# Patient Record
Sex: Female | Born: 1960 | Race: White | Hispanic: No | Marital: Married | Smoking: Current every day smoker
Health system: Southern US, Community
[De-identification: ages and names within clinical notes are randomized; demographics above are authoritative.]

## PROBLEM LIST (undated history)

## (undated) DIAGNOSIS — M25519 Pain in unspecified shoulder: Secondary | ICD-10-CM

## (undated) DIAGNOSIS — K589 Irritable bowel syndrome without diarrhea: Secondary | ICD-10-CM

## (undated) DIAGNOSIS — Z8489 Family history of other specified conditions: Secondary | ICD-10-CM

## (undated) DIAGNOSIS — G43909 Migraine, unspecified, not intractable, without status migrainosus: Secondary | ICD-10-CM

## (undated) DIAGNOSIS — G8929 Other chronic pain: Secondary | ICD-10-CM

## (undated) HISTORY — PX: COLONOSCOPY: SHX174

## (undated) HISTORY — PX: BACK SURGERY: SHX140

---

## 1997-11-21 ENCOUNTER — Other Ambulatory Visit: Admission: RE | Admit: 1997-11-21 | Discharge: 1997-11-21 | Payer: Self-pay | Admitting: *Deleted

## 1998-05-04 ENCOUNTER — Ambulatory Visit (HOSPITAL_COMMUNITY): Admission: RE | Admit: 1998-05-04 | Discharge: 1998-05-04 | Payer: Self-pay | Admitting: Gastroenterology

## 1998-10-30 ENCOUNTER — Encounter: Payer: Self-pay | Admitting: Family Medicine

## 1998-10-30 ENCOUNTER — Ambulatory Visit (HOSPITAL_COMMUNITY): Admission: RE | Admit: 1998-10-30 | Discharge: 1998-10-30 | Payer: Self-pay | Admitting: Family Medicine

## 1998-12-30 ENCOUNTER — Other Ambulatory Visit: Admission: RE | Admit: 1998-12-30 | Discharge: 1998-12-30 | Payer: Self-pay | Admitting: *Deleted

## 1999-11-25 ENCOUNTER — Encounter: Payer: Self-pay | Admitting: Family Medicine

## 1999-11-25 ENCOUNTER — Encounter: Admission: RE | Admit: 1999-11-25 | Discharge: 1999-11-25 | Payer: Self-pay | Admitting: Family Medicine

## 1999-12-22 ENCOUNTER — Encounter: Payer: Self-pay | Admitting: Neurosurgery

## 1999-12-22 ENCOUNTER — Ambulatory Visit (HOSPITAL_COMMUNITY): Admission: RE | Admit: 1999-12-22 | Discharge: 1999-12-22 | Payer: Self-pay | Admitting: Neurosurgery

## 2001-07-20 ENCOUNTER — Encounter: Payer: Self-pay | Admitting: Family Medicine

## 2001-07-20 ENCOUNTER — Ambulatory Visit (HOSPITAL_COMMUNITY): Admission: RE | Admit: 2001-07-20 | Discharge: 2001-07-20 | Payer: Self-pay | Admitting: Family Medicine

## 2002-02-28 HISTORY — PX: BREAST CYST ASPIRATION: SHX578

## 2002-09-26 ENCOUNTER — Other Ambulatory Visit: Admission: RE | Admit: 2002-09-26 | Discharge: 2002-09-26 | Payer: Self-pay | Admitting: Obstetrics and Gynecology

## 2003-12-16 ENCOUNTER — Ambulatory Visit (HOSPITAL_COMMUNITY): Admission: RE | Admit: 2003-12-16 | Discharge: 2003-12-16 | Payer: Self-pay | Admitting: Family Medicine

## 2005-05-30 ENCOUNTER — Other Ambulatory Visit: Admission: RE | Admit: 2005-05-30 | Discharge: 2005-05-30 | Payer: Self-pay | Admitting: Obstetrics and Gynecology

## 2005-09-22 ENCOUNTER — Encounter: Admission: RE | Admit: 2005-09-22 | Discharge: 2005-09-22 | Payer: Self-pay | Admitting: Obstetrics and Gynecology

## 2005-10-12 ENCOUNTER — Ambulatory Visit: Payer: Self-pay | Admitting: Family Medicine

## 2006-07-05 ENCOUNTER — Telehealth (INDEPENDENT_AMBULATORY_CARE_PROVIDER_SITE_OTHER): Payer: Self-pay | Admitting: *Deleted

## 2006-12-03 ENCOUNTER — Emergency Department (HOSPITAL_COMMUNITY): Admission: EM | Admit: 2006-12-03 | Discharge: 2006-12-03 | Payer: Self-pay | Admitting: Emergency Medicine

## 2007-03-02 ENCOUNTER — Ambulatory Visit: Payer: Self-pay | Admitting: Family Medicine

## 2007-03-02 DIAGNOSIS — F17200 Nicotine dependence, unspecified, uncomplicated: Secondary | ICD-10-CM

## 2007-03-02 DIAGNOSIS — J019 Acute sinusitis, unspecified: Secondary | ICD-10-CM | POA: Insufficient documentation

## 2007-03-16 ENCOUNTER — Telehealth: Payer: Self-pay | Admitting: Family Medicine

## 2007-06-06 ENCOUNTER — Telehealth: Payer: Self-pay | Admitting: Family Medicine

## 2007-08-13 ENCOUNTER — Ambulatory Visit: Payer: Self-pay | Admitting: Family Medicine

## 2007-08-13 DIAGNOSIS — B9789 Other viral agents as the cause of diseases classified elsewhere: Secondary | ICD-10-CM

## 2007-09-13 ENCOUNTER — Telehealth (INDEPENDENT_AMBULATORY_CARE_PROVIDER_SITE_OTHER): Payer: Self-pay | Admitting: *Deleted

## 2007-12-11 ENCOUNTER — Telehealth: Payer: Self-pay | Admitting: Family Medicine

## 2008-02-14 ENCOUNTER — Telehealth: Payer: Self-pay | Admitting: Family Medicine

## 2008-05-27 ENCOUNTER — Telehealth: Payer: Self-pay | Admitting: Family Medicine

## 2008-08-29 ENCOUNTER — Telehealth: Payer: Self-pay | Admitting: Family Medicine

## 2008-11-05 ENCOUNTER — Telehealth: Payer: Self-pay | Admitting: Family Medicine

## 2009-02-16 ENCOUNTER — Emergency Department (HOSPITAL_COMMUNITY): Admission: EM | Admit: 2009-02-16 | Discharge: 2009-02-16 | Payer: Self-pay | Admitting: Family Medicine

## 2009-06-08 ENCOUNTER — Telehealth: Payer: Self-pay | Admitting: Family Medicine

## 2010-03-30 NOTE — Progress Notes (Signed)
Summary: Rx Ascomp with Codeine  Phone Note Refill Request Call back at [phone] Message from:  [facility] on June 08, 2009 11:01 AM  Refills Requested: Medication #1:  ASCOMP-CODEINE 50-325-40-30 MG  CAPS one every six hours as needed   Last Refilled: 04/06/2009   Dosage confirmed as above?Dosage Confirmed Received faxed refill request, patient hasn't been seen since 2009, please advise.   Method Requested: Telephone to Pharmacy Initial call taken by: Linde Gillis CMA Duncan Dull),  June 08, 2009 11:02 AM Caller: [facility] Call For: Dr. Milinda Antis  Summary of Call: Received fa  Follow-up for Phone Call        she has not been seen since 2009 I think sched f/u this spring or early summer then can refil med px written on EMR for call in  Follow-up by: Judith Part MD,  June 08, 2009 1:33 PM  Additional Follow-up for Phone Call Additional follow up Details #1::        Left message for patient to call back.  Medication has not been called yet waiting to hear from pt.Lewanda Rife LPN  June 08, 2009 4:42 PM   Spoke with pt, advised her that she needs to make appt and then medicine will be called in.  She says she is unable to come in anytime soon.  She has no insurance and husband recently had back surgery, she cant afford office visit.  Please advise. Additional Follow-up by: Lowella Petties CMA,  June 09, 2009 8:54 AM    Additional Follow-up for Phone Call Additional follow up Details #2::    can refil times one and no more after that  [facility] [place] -- they specialize in pts without insurance to make costs less  let me know when she does and we can send any records they need   Follow-up by: Judith Part MD,  June 09, 2009 9:05 AM  Additional Follow-up for Phone Call Additional follow up Details #3:: Details for Additional Follow-up Action Taken: Patient notified as instructed by telephone. Pt said when she could work out to come in and  get money for visit she would call back and make appt. [facility] as instructed. Lewanda Rife LPN  June 09, 2009 9:22 AM   Prescriptions: ASCOMP-CODEINE 50-325-40-30 MG  CAPS (BUTALBITAL-ASA-CAFF-CODEINE) one every six hours as needed  #30 x 1   Entered and Authorized by:   Judith Part MD   Signed by:   Lewanda Rife LPN on 45/40/9811   Method used:   Telephoned to ...         RxID:   [id number]

## 2010-05-31 LAB — POCT I-STAT, CHEM 8
BUN: 8 mg/dL (ref 6–23)
Calcium, Ion: 1.13 mmol/L (ref 1.12–1.32)
Glucose, Bld: 97 mg/dL (ref 70–99)
Hemoglobin: 14.3 g/dL (ref 12.0–15.0)
Potassium: 4.3 mEq/L (ref 3.5–5.1)
Sodium: 137 mEq/L (ref 135–145)

## 2010-05-31 LAB — DIFFERENTIAL
Basophils Absolute: 0.1 10*3/uL (ref 0.0–0.1)
Eosinophils Absolute: 0.3 10*3/uL (ref 0.0–0.7)
Eosinophils Relative: 3 % (ref 0–5)
Lymphocytes Relative: 27 % (ref 12–46)
Neutro Abs: 5.9 10*3/uL (ref 1.7–7.7)

## 2010-05-31 LAB — CBC
MCHC: 34.5 g/dL (ref 30.0–36.0)
RBC: 4.41 MIL/uL (ref 3.87–5.11)
RDW: 13.9 % (ref 11.5–15.5)
WBC: 9.2 10*3/uL (ref 4.0–10.5)

## 2010-07-13 NOTE — Assessment & Plan Note (Signed)
[facility]                                 ON-CALL NOTE   NAME:[name], [name]                       MRN:          [id number]  DATE:12/03/2006                            DOB:          12/13/1960    TIME:  3:48 p.m.   PHONE NUMBER:  [phone].   REGULAR DOCTOR:  Arta Silence, M.D.   CHIEF COMPLAINT:  Stress.   The patient says that she has been under a tremendous amount of stress  lately.  Her daughter has been very sick under the care of several  doctors and is missing school.  There is talk of actual legal action  against her because she has missed so much school.  Today she is feeling  very stressed.  Her chest is tight and starting to hurt.  She went to a  drugstore and checked her blood pressure which was 181/96.  She has no  history of high blood pressure and she does not take any medicine for  that.  She is very worried about how bad she feels right now and the  chest symptoms.  I told her with chest pain and high blood pressure she  needs to go to the emergency room for further evaluation.  She is going  to go to [facility] in case she will need the help of  Behavioral Health as well.     Marne A. Tower, MD  Electronically Signed    MAT/MedQ  DD: 12/03/2006  DT: 12/03/2006  Job #: [id number]   cc:   Arta Silence, MD

## 2010-07-13 NOTE — Assessment & Plan Note (Signed)
[facility]ALTHC[facility]                                 ON-CALL NOTE   NAME:Mariah Douglas, Mariah Douglas                     MRN:          [id number]  DATE:08/13/2007                            DOB:          [birth year]    PHONE NUMBER:  [phone]   PRIMARY CARE PHYSICIAN:  Arta Silence, M.D.   SUBJECTIVE:  Seen in the office today after several days of fever,  cough, congestion, diarrhea.  She now has a further elevated temperature  of 100.6.  She told in the office that she had a virus.  She was worried  about the H1N1 flu.   ASSESSMENT:  She has no specific contacts with H1N1 or recent trips to  [place].  She is not sick enough to be hospitalized.  I answered her  question as far as needing to be tested for H1N1 as negative.  I advised  her not to come back to the office for further testing.  If she does  have an H1N1 we want to decrease transmission as much as possible.  If  she has further concerns she can call her regular doctor during work  hours about H1N1 testing, although, I told her that the health  department guidelines state no testing for anyone who is not sick enough  to be hospitalized or without multiple medical issues.  She states that  she is concerned because of her daughter who has been sick over the past  year.  I suggested for her to keep up contact precautions with staying  away from her daughter as well as washing hands and wearing a mask.  She  may take ibuprofen 100 mg for fever.  If her symptoms progress she will  call her primary care doctor.     Kerby Nora, MD  Electronically Signed    AB/MedQ  DD: 08/13/2007  DT: 08/13/2007  Job #: [id number]

## 2010-07-16 NOTE — Op Note (Signed)
[place]  Patient:    Mariah Douglas, Mariah Douglas                     MRN: [id number] Proc. Date: 12/22/99 Adm. 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  PROCEDURE:  Left L4-5 laminotomy and microdiskectomy.  SURGEON:  Julio Sicks, M.D.  ASSISTANT:  Donzetta Sprung. Roney Jaffe., M.D.  ANESTHESIA:  General endotracheal.  INDICATIONS:  [name] is a 50 year old female with a history of back and left lower extremity pain.  This is most consistent with a left-sided L5 radiculopathy, for which she saw Dr. _____.  MRI scan demonstrates a leftward L4-5 disk herniation with an inferior free fragment causing compression on the left-sided L5 nerve root.  The patient had been counseled as to her options. She has decided to proceed with a left-sided L4-5 laminotomy and microdiskectomy with, hopefully, relief of her symptoms.  DESCRIPTION OF PROCEDURE:  [name] was placed on the operating table in the supine position.  After an adequate level of anesthesia was achieved, the patient was positioned prone on the Wilson frame, appropriately padded.  The lumbar region was shaved, prepped, and draped sterilely, using a 10 blade to make a skin incision overlying the L4-5 interspace.  This was carried down sharply in the midline.  Subperiosteal dissection was then performed on the left side, exposing the lamina and facet joints of L4 and L5.  The deep self-retaining retractor was placed, and _____ was performed.  A laminotomy was then performed using a high-speed drill and Kerrison rongeurs to _____ the inferior inferior one-third of the lamina of L4, the medial edge of the L4-5 facet joint, and the superior rim of the L5 lamina.  Ligamentum flavum was then  elevated and resected in a piecemeal fashion using Kerrison rongeurs, and the thecal sac and the exiting L5 nerve root were identified.  _____ and microdissection over the left-sided L5 nerve root and underlying disk herniation.  After _____, the thecal sac and L5 nerve root were mobilized and retracted with _____ using the DErrico nerve root retractor.  The inferior free fragment came readily into view.  This was dissected free using blunt nerve hooks and pituitary rongeurs.  This was removed completely.  The disk space was then isolated, incised with a 15 blade, and _____ disk space cleanout achieved using pituitary rongeurs, upward-angled pituitary rongeurs, and Epstein curettes.  All loose _____ and disk material was removed from the interspace.  The canal was inspected.  All the disk herniation was completely resected.  There was no evidence of any injury to the thecal sac or nerve roots.  The wound was then copiously irrigated with antibiotic solution, where Gelfoam was placed for operative hemostasis, which which was found to be good.  The microscope and retractor system were removed. Hemostasis in the _____ was achieved with electrocautery.  The wound was then closed in layers with Vicryl sutures.  Steri-Strips were applied to the surface.  There were no apparent complications.  The patient tolerated the procedure well, and she returned to the recovery room in good postoperative condition. DD:  12/22/99 TD:  12/22/99 Job: [id number]

## 2010-12-09 LAB — I-STAT 8, (EC8 V) (CONVERTED LAB)
Acid-Base Excess: 1
BUN: 5 — ABNORMAL LOW
Bicarbonate: 25.6 — ABNORMAL HIGH
Chloride: 105
Glucose, Bld: 91
HCT: 38
Hemoglobin: 12.9
Operator id: 196461
Potassium: 4.2
Sodium: 136
TCO2: 27
pCO2, Ven: 40.5 — ABNORMAL LOW
pH, Ven: 7.409 — ABNORMAL HIGH

## 2010-12-09 LAB — POCT CARDIAC MARKERS
CKMB, poc: <1 — ABNORMAL LOW
Myoglobin, poc: 42.4
Operator id: 196461
Troponin i, poc: <0.05

## 2010-12-09 LAB — POCT I-STAT CREATININE
Creatinine, Ser: 0.9
Operator id: 196461

## 2011-10-04 ENCOUNTER — Emergency Department (HOSPITAL_COMMUNITY): Payer: 59

## 2011-10-04 ENCOUNTER — Encounter (HOSPITAL_COMMUNITY): Payer: Self-pay | Admitting: *Deleted

## 2011-10-04 ENCOUNTER — Emergency Department (HOSPITAL_COMMUNITY)
Admission: EM | Admit: 2011-10-04 | Discharge: 2011-10-04 | Disposition: A | Discharge status: Home / Self Care | Payer: 59 | Attending: Emergency Medicine | Admitting: Emergency Medicine

## 2011-10-04 DIAGNOSIS — F17200 Nicotine dependence, unspecified, uncomplicated: Secondary | ICD-10-CM | POA: Insufficient documentation

## 2011-10-04 DIAGNOSIS — M25519 Pain in unspecified shoulder: Secondary | ICD-10-CM | POA: Insufficient documentation

## 2011-10-04 HISTORY — DX: Migraine, unspecified, not intractable, without status migrainosus: G43.909

## 2011-10-04 MED ORDER — IBUPROFEN 800 MG PO TABS: 800.0000 mg | ORAL_TABLET | Freq: Three times a day (TID) | ORAL | Status: AC

## 2011-10-04 MED ORDER — IBUPROFEN 400 MG PO TABS
800.0000 mg | ORAL_TABLET | Freq: Once | ORAL | Status: AC
  Administered 2011-10-04: 800 mg via ORAL
  Filled 2011-10-04: qty 2

## 2011-10-04 NOTE — ED Notes (Signed)
To ED for eval of right shoulder pain and swelling from axilla down right side. No injury noted. No cp. No sob. No crepitus noted to chest. Speaking in full clear sentences.

## 2011-10-04 NOTE — Discharge Instructions (Signed)
Take ibuprofen up to three times daily. Rest your shoulder. Avoid heavy lifting. Apply ice when possible. Follow up with your doctor as scheduled.

## 2011-10-04 NOTE — ED Provider Notes (Signed)
History     CSN: [id number]  Arrival date & time 10/04/11  1303   First MD Initiated Contact with Patient 10/04/11 1338      Chief Complaint  Patient presents with  . Shoulder Pain  . Pain    (Consider location/radiation/quality/duration/timing/severity/associated sxs/prior treatment) HPI Comments: 51 y/o female with chronic right shoulder pain presents with worsening of her pain over the past few days with associated swelling in her armpit. No known injury or trauma. States the pain radiates down her upper arm. In the past she had cortisone shots in her shoulder which helped, but she has not had insurance the past 3 years and was unable to follow up. Denies any neck pain or injury, chest pain, rashes, fever, chills, recent illness, other joint pains. She has never had her shoulder imaged. Also admits to tingling in all fingers of right hand. This is not a new problem, but she states it has become more constant.  Patient is a 51 y.o. female presenting with shoulder pain. The history is provided by the patient.  Shoulder Pain Associated symptoms include arthralgias. Pertinent negatives include no chest pain, chills, fever, neck pain or rash.    Past Medical History  Diagnosis Date  . Migraine     Past Surgical History  Procedure Date  . Back surgery     History reviewed. No pertinent family history.  History  Substance Use Topics  . Smoking status: Current Everyday Smoker    Types: Cigarettes  . Smokeless tobacco: Not on file  . Alcohol Use:     OB History    Grav Para Term Preterm Abortions TAB SAB Ect Mult Living                  Review of Systems  Constitutional: Negative for fever and chills.  HENT: Negative for neck pain.   Cardiovascular: Negative for chest pain.  Musculoskeletal: Positive for arthralgias.       Right shoulder pain and swelling  Skin: Negative for color change and rash.  Neurological:       Tingling in all fingers of right hand     Allergies  Hydrocodone-acetaminophen; Metronidazole; and Prednisone  Home Medications   Current Outpatient Rx  Name Route Sig Dispense Refill  . BUTALBITAL-ASA-CAFF-CODEINE 50-325-40-30 MG PO CAPS Oral Take 1 capsule by mouth every 4 (four) hours as needed. headache    . NYQUIL PO Oral Take 15 mLs by mouth every 4 (four) hours as needed. Cold symptoms and congestion      BP 163/89  Pulse 100  Temp 98.6 F (37 C) (Oral)  Resp 16  SpO2 96%  Physical Exam  Constitutional: She is oriented to person, place, and time. She appears well-developed and well-nourished. No distress.  HENT:  Head: Normocephalic and atraumatic.  Mouth/Throat: Oropharynx is clear and moist. No oropharyngeal exudate.  Eyes: Conjunctivae are normal.  Neck: Normal range of motion. Neck supple. No spinous process tenderness and no muscular tenderness present.  Cardiovascular: Normal rate, regular rhythm, normal heart sounds and intact distal pulses.   Pulmonary/Chest: Effort normal and breath sounds normal.  Musculoskeletal:       Full right active ROM, slow due to pain. Generalized tenderness to palpation of right shoulder girdle. Edema present in right axilla. No overlying erythema. No palpable masses. Non tender.  Lymphadenopathy:    She has no axillary adenopathy.  Neurological: She is alert and oriented to person, place, and time. She has normal strength. Sensory deficit:  decreased sensation to light touch on right palmer aspect of all 5 digits.  Skin: Skin is warm and dry. No rash noted. No erythema.       No evidence of infection, abscess.  Psychiatric: She has a normal mood and affect. Her behavior is normal.    ED Course  Procedures (including critical care time)  Labs Reviewed - No data to display Dg Shoulder Right  10/04/2011  *RADIOLOGY REPORT*  Clinical Data: Right shoulder pain, swelling.  No known injury.  RIGHT SHOULDER - 2+ VIEW  Comparison: None  Findings: No acute bony abnormality.   Specifically, no fracture, subluxation, or dislocation.  Soft tissues are intact.  Joint spaces are maintained.  Normal bone mineralization.  IMPRESSION: No acute bony abnormality.  Original Report Authenticated By: Cyndie Chime, M.D.     1. Shoulder pain       MDM  51 y/o female with worsening right shoulder pain and swelling. X-ray normal. Axillary swelling most likely from worsening shoulder. No lymphadenopathy palpable. Patient has appointment with PCP at the beginning of next month. Advised her to follow up with PCP about chronic shoulder pain. Patient also seen by Dr. Deretha Emory who agrees with findings and plan of care. Discharge patient with ibuprofen, rest, ice.         Trevor Mace, PA-C 10/04/11 1438

## 2011-10-05 NOTE — ED Provider Notes (Signed)
Medical screening examination/treatment/procedure(s) were conducted as a shared visit with non-physician practitioner(s) and myself.  I personally evaluated the patient during the encounter   Patient seen by me, right arm axillary area swelling noted, no adenopathy, no distal forearm or hand swelling, son not likely DVT and not consistent with breast mass with adenopathy. Will treat as related to shoulder inflammation and follow up with Orthopedics. Patient given precautions to have further follow up if not resolved in case related to another cause.   Shelda Jakes, MD 10/05/11 1125

## 2011-11-17 ENCOUNTER — Ambulatory Visit: Payer: Self-pay

## 2012-06-25 ENCOUNTER — Ambulatory Visit: Payer: Self-pay | Admitting: Surgery

## 2013-02-04 ENCOUNTER — Ambulatory Visit: Payer: Self-pay | Admitting: Unknown Physician Specialty

## 2013-05-16 ENCOUNTER — Ambulatory Visit: Payer: Self-pay | Admitting: Physical Medicine and Rehabilitation

## 2013-07-23 ENCOUNTER — Ambulatory Visit: Payer: Self-pay

## 2013-07-31 ENCOUNTER — Other Ambulatory Visit: Payer: Self-pay | Admitting: Neurosurgery

## 2013-08-02 ENCOUNTER — Encounter (HOSPITAL_COMMUNITY): Payer: Self-pay | Admitting: Pharmacy Technician

## 2013-08-05 ENCOUNTER — Encounter (HOSPITAL_COMMUNITY)
Admission: RE | Admit: 2013-08-05 | Discharge: 2013-08-05 | Disposition: A | Discharge status: Home / Self Care | Payer: 59 | Source: Ambulatory Visit | Attending: Neurosurgery | Admitting: Neurosurgery

## 2013-08-05 ENCOUNTER — Encounter (HOSPITAL_COMMUNITY): Payer: Self-pay

## 2013-08-05 HISTORY — DX: Family history of other specified conditions: Z84.89

## 2013-08-05 HISTORY — DX: Irritable bowel syndrome, unspecified: K58.9

## 2013-08-05 LAB — HCG, SERUM, QUALITATIVE: Preg, Serum: NEGATIVE

## 2013-08-05 LAB — BASIC METABOLIC PANEL
BUN: 10 mg/dL (ref 6–23)
CHLORIDE: 103 meq/L (ref 96–112)
CO2: 27 meq/L (ref 19–32)
CREATININE: 0.86 mg/dL (ref 0.50–1.10)
Calcium: 10 mg/dL (ref 8.4–10.5)
GFR calc Af Amer: 88 mL/min — ABNORMAL LOW (ref >90)
GFR calc non Af Amer: 76 mL/min — ABNORMAL LOW (ref >90)
Glucose, Bld: 115 mg/dL — ABNORMAL HIGH (ref 70–99)
Potassium: 4.3 mEq/L (ref 3.7–5.3)
SODIUM: 142 meq/L (ref 137–147)

## 2013-08-05 LAB — CBC WITH DIFFERENTIAL/PLATELET
Basophils Absolute: 0 10*3/uL (ref 0.0–0.1)
Basophils Relative: 0 % (ref 0–1)
EOS PCT: 3 % (ref 0–5)
Eosinophils Absolute: 0.2 10*3/uL (ref 0.0–0.7)
HEMATOCRIT: 42.2 % (ref 36.0–46.0)
Hemoglobin: 14.1 g/dL (ref 12.0–15.0)
LYMPHS PCT: 30 % (ref 12–46)
Lymphs Abs: 2.4 10*3/uL (ref 0.7–4.0)
MCH: 30.7 pg (ref 26.0–34.0)
MCHC: 33.4 g/dL (ref 30.0–36.0)
MCV: 91.9 fL (ref 78.0–100.0)
MONO ABS: 0.6 10*3/uL (ref 0.1–1.0)
Monocytes Relative: 8 % (ref 3–12)
Neutro Abs: 4.6 10*3/uL (ref 1.7–7.7)
Neutrophils Relative %: 59 % (ref 43–77)
Platelets: 198 10*3/uL (ref 150–400)
RBC: 4.59 MIL/uL (ref 3.87–5.11)
RDW: 13.9 % (ref 11.5–15.5)
WBC: 7.9 10*3/uL (ref 4.0–10.5)

## 2013-08-05 LAB — SURGICAL PCR SCREEN
MRSA, PCR: NEGATIVE
Staphylococcus aureus: NEGATIVE

## 2013-08-05 MED ORDER — CEFAZOLIN SODIUM-DEXTROSE 2-3 GM-% IV SOLR
2.0000 g | INTRAVENOUS | Status: AC
  Administered 2013-08-06: 2 g via INTRAVENOUS
  Filled 2013-08-05: qty 50

## 2013-08-05 NOTE — Pre-Procedure Instructions (Signed)
Mariah Douglas  08/05/2013   Your procedure is scheduled on:  Tuesday, June 9.  Report to [facility] Admitting at 6:45 AM.  Call this number if you have problems the morning of surgery: [phone]   Remember:   Do not eat food or drink liquids after midnight Monday, June 8.   Take these medicines the morning of surgery with A SIP OF WATER: Take if needed: cyclobenzaprine (FLEXERIL) and pain medication.                Stop taking Vitamin C and butalbital-aspirin-caffeine-codeine (ASCOMP-CODEINE).    Do not wear jewelry, make-up or nail polish.  Do not wear lotions, powders, or perfumes.               Do not shave 48 hours prior to surgery.   Do not bring valuables to the hospital.             [facility] is not responsible  for any belongings or valuables.               Contacts, dentures or bridgework may not be worn into surgery.  Leave suitcase in the car. After surgery it may be brought to your room.  For patients admitted to the hospital, discharge time is determined by your treatment team.               Patients discharged the day of surgery will not be allowed to drive home.  Name and phone number of your driver: -   Special Instructions: Review  [facility] - Preparing For Surgery.   Please read over the following fact sheets that you were given: Pain Booklet, Coughing and Deep Breathing and Surgical Site Infection Prevention

## 2013-08-05 NOTE — Pre-Procedure Instructions (Signed)
Mariah Douglas  08/05/2013   Your procedure is scheduled on:  Tuesday, June 9.  Report to [facility] Admitting at 6:45 AM.  Call this number if you have problems the morning of surgery: [phone]   Remember:   Do not eat food or drink liquids after midnight Monday, June 8.   Take these medicines the morning of surgery with A SIP OF WATER: Take if needed: cyclobenzaprine (FLEXERIL) and pain medication.     Do not wear jewelry, make-up or nail polish.  Do not wear lotions, powders, or perfumes.               Do not shave 48 hours prior to surgery.   Do not bring valuables to the hospital.             [facility] is not responsible  for any belongings or valuables.               Contacts, dentures or bridgework may not be worn into surgery.  Leave suitcase in the car. After surgery it may be brought to your room.  For patients admitted to the hospital, discharge time is determined by your   treatment team.               Patients discharged the day of surgery will not be allowed to drive home.  Name and phone number of your driver: -   Special Instructions: Review  [facility] - Preparing For Surgery.   Please read over the following fact sheets that you were given: Pain Booklet, Coughing and Deep Breathing and Surgical Site Infection Prevention

## 2013-08-06 ENCOUNTER — Inpatient Hospital Stay (HOSPITAL_COMMUNITY)
Admission: RE | Admit: 2013-08-06 | Discharge: 2013-08-07 | DRG: 472 | Disposition: A | Discharge status: Home / Self Care | Payer: 59 | Source: Ambulatory Visit | Attending: Neurosurgery | Admitting: Neurosurgery

## 2013-08-06 ENCOUNTER — Inpatient Hospital Stay (HOSPITAL_COMMUNITY): Payer: 59

## 2013-08-06 ENCOUNTER — Inpatient Hospital Stay (HOSPITAL_COMMUNITY): Payer: 59 | Admitting: Anesthesiology

## 2013-08-06 ENCOUNTER — Encounter (HOSPITAL_COMMUNITY)
Admission: RE | Disposition: A | Discharge status: Home / Self Care | Payer: Self-pay | Source: Ambulatory Visit | Attending: Neurosurgery

## 2013-08-06 ENCOUNTER — Encounter (HOSPITAL_COMMUNITY): Payer: Self-pay | Admitting: *Deleted

## 2013-08-06 ENCOUNTER — Encounter (HOSPITAL_COMMUNITY): Payer: 59 | Admitting: Anesthesiology

## 2013-08-06 DIAGNOSIS — Z79899 Other long term (current) drug therapy: Secondary | ICD-10-CM

## 2013-08-06 DIAGNOSIS — F17200 Nicotine dependence, unspecified, uncomplicated: Secondary | ICD-10-CM | POA: Diagnosis present

## 2013-08-06 DIAGNOSIS — F411 Generalized anxiety disorder: Secondary | ICD-10-CM | POA: Diagnosis present

## 2013-08-06 DIAGNOSIS — M47812 Spondylosis without myelopathy or radiculopathy, cervical region: Principal | ICD-10-CM | POA: Diagnosis present

## 2013-08-06 DIAGNOSIS — M4722 Other spondylosis with radiculopathy, cervical region: Secondary | ICD-10-CM | POA: Diagnosis present

## 2013-08-06 DIAGNOSIS — Z6841 Body Mass Index (BMI) 40.0 and over, adult: Secondary | ICD-10-CM

## 2013-08-06 DIAGNOSIS — Z01812 Encounter for preprocedural laboratory examination: Secondary | ICD-10-CM

## 2013-08-06 HISTORY — PX: ANTERIOR CERVICAL DECOMP/DISCECTOMY FUSION: SHX1161

## 2013-08-06 SURGERY — ANTERIOR CERVICAL DECOMPRESSION/DISCECTOMY FUSION 3 LEVELS
Anesthesia: General | Site: Neck

## 2013-08-06 MED ORDER — PHENOL 1.4 % MT LIQD
1.0000 | OROMUCOSAL | Status: DC | PRN
  Administered 2013-08-06: 1 via OROMUCOSAL
  Filled 2013-08-06: qty 177

## 2013-08-06 MED ORDER — OXYCODONE HCL 5 MG PO TABS
5.0000 mg | ORAL_TABLET | Freq: Once | ORAL | Status: AC | PRN
  Administered 2013-08-06: 5 mg via ORAL

## 2013-08-06 MED ORDER — SODIUM CHLORIDE 0.9 % IR SOLN
Status: DC | PRN
  Administered 2013-08-06: 08:00:00

## 2013-08-06 MED ORDER — PROPOFOL 10 MG/ML IV BOLUS
INTRAVENOUS | Status: AC
  Filled 2013-08-06: qty 20

## 2013-08-06 MED ORDER — MIDAZOLAM HCL 2 MG/2ML IJ SOLN
INTRAMUSCULAR | Status: AC
  Filled 2013-08-06: qty 2

## 2013-08-06 MED ORDER — SENNA 8.6 MG PO TABS
1.0000 | ORAL_TABLET | Freq: Two times a day (BID) | ORAL | Status: DC
  Administered 2013-08-06: 8.6 mg via ORAL
  Filled 2013-08-06 (×3): qty 1

## 2013-08-06 MED ORDER — 0.9 % SODIUM CHLORIDE (POUR BTL) OPTIME
TOPICAL | Status: DC | PRN
  Administered 2013-08-06: 1000 mL

## 2013-08-06 MED ORDER — ONDANSETRON HCL 4 MG/2ML IJ SOLN: 4.0000 mg | INTRAMUSCULAR | Status: DC | PRN

## 2013-08-06 MED ORDER — MENTHOL 3 MG MT LOZG
1.0000 | LOZENGE | OROMUCOSAL | Status: DC | PRN
  Filled 2013-08-06: qty 9

## 2013-08-06 MED ORDER — GLYCOPYRROLATE 0.2 MG/ML IJ SOLN
INTRAMUSCULAR | Status: DC | PRN
  Administered 2013-08-06: 0.6 mg via INTRAVENOUS

## 2013-08-06 MED ORDER — SUCCINYLCHOLINE CHLORIDE 20 MG/ML IJ SOLN
INTRAMUSCULAR | Status: AC
  Filled 2013-08-06: qty 1

## 2013-08-06 MED ORDER — ALUM & MAG HYDROXIDE-SIMETH 200-200-20 MG/5ML PO SUSP: 30.0000 mL | Freq: Four times a day (QID) | ORAL | Status: DC | PRN

## 2013-08-06 MED ORDER — LIDOCAINE HCL (CARDIAC) 20 MG/ML IV SOLN
INTRAVENOUS | Status: AC
  Filled 2013-08-06: qty 5

## 2013-08-06 MED ORDER — HYDROMORPHONE HCL PF 1 MG/ML IJ SOLN
INTRAMUSCULAR | Status: AC
  Filled 2013-08-06: qty 1

## 2013-08-06 MED ORDER — ROCURONIUM BROMIDE 50 MG/5ML IV SOLN
INTRAVENOUS | Status: AC
  Filled 2013-08-06: qty 1

## 2013-08-06 MED ORDER — OXYCODONE HCL 5 MG PO TABS: 10.0000 mg | ORAL_TABLET | ORAL | Status: DC | PRN

## 2013-08-06 MED ORDER — FENTANYL CITRATE 0.05 MG/ML IJ SOLN
INTRAMUSCULAR | Status: AC
  Filled 2013-08-06: qty 5

## 2013-08-06 MED ORDER — THROMBIN 20000 UNITS EX KIT
PACK | CUTANEOUS | Status: DC | PRN
  Administered 2013-08-06: 08:00:00 via TOPICAL

## 2013-08-06 MED ORDER — ACETAMINOPHEN 650 MG RE SUPP: 650.0000 mg | RECTAL | Status: DC | PRN

## 2013-08-06 MED ORDER — MIDAZOLAM HCL 5 MG/5ML IJ SOLN
INTRAMUSCULAR | Status: DC | PRN
  Administered 2013-08-06: 2 mg via INTRAVENOUS

## 2013-08-06 MED ORDER — ROCURONIUM BROMIDE 100 MG/10ML IV SOLN
INTRAVENOUS | Status: DC | PRN
  Administered 2013-08-06: 20 mg via INTRAVENOUS
  Administered 2013-08-06 (×2): 10 mg via INTRAVENOUS
  Administered 2013-08-06: 20 mg via INTRAVENOUS
  Administered 2013-08-06: 50 mg via INTRAVENOUS
  Administered 2013-08-06: 10 mg via INTRAVENOUS

## 2013-08-06 MED ORDER — CEFAZOLIN SODIUM 1-5 GM-% IV SOLN
1.0000 g | Freq: Three times a day (TID) | INTRAVENOUS | Status: AC
  Administered 2013-08-06 (×2): 1 g via INTRAVENOUS
  Filled 2013-08-06 (×2): qty 50

## 2013-08-06 MED ORDER — ALBUMIN HUMAN 5 % IV SOLN
INTRAVENOUS | Status: DC | PRN
  Administered 2013-08-06: 10:00:00 via INTRAVENOUS

## 2013-08-06 MED ORDER — SODIUM CHLORIDE 0.9 % IJ SOLN
3.0000 mL | INTRAMUSCULAR | Status: DC | PRN
Start: 2013-08-06 — End: 2013-08-07

## 2013-08-06 MED ORDER — ARTIFICIAL TEARS OP OINT
TOPICAL_OINTMENT | OPHTHALMIC | Status: DC | PRN
  Administered 2013-08-06: 1 via OPHTHALMIC

## 2013-08-06 MED ORDER — CYCLOBENZAPRINE HCL 10 MG PO TABS
ORAL_TABLET | ORAL | Status: AC
  Filled 2013-08-06: qty 1

## 2013-08-06 MED ORDER — OXYCODONE HCL 5 MG PO TABS
ORAL_TABLET | ORAL | Status: AC
  Filled 2013-08-06: qty 1

## 2013-08-06 MED ORDER — HYDROMORPHONE HCL PF 1 MG/ML IJ SOLN
0.2500 mg | INTRAMUSCULAR | Status: DC | PRN
  Administered 2013-08-06 (×3): 0.5 mg via INTRAVENOUS

## 2013-08-06 MED ORDER — PROMETHAZINE HCL 25 MG/ML IJ SOLN: 6.2500 mg | INTRAMUSCULAR | Status: DC | PRN

## 2013-08-06 MED ORDER — TRAMADOL HCL 50 MG PO TABS: 50.0000 mg | ORAL_TABLET | Freq: Three times a day (TID) | ORAL | Status: DC | PRN

## 2013-08-06 MED ORDER — LIDOCAINE HCL (CARDIAC) 20 MG/ML IV SOLN
INTRAVENOUS | Status: DC | PRN
  Administered 2013-08-06: 40 mg via INTRAVENOUS

## 2013-08-06 MED ORDER — OXYCODONE-ACETAMINOPHEN 5-325 MG PO TABS: 1.0000 | ORAL_TABLET | ORAL | Status: DC | PRN

## 2013-08-06 MED ORDER — OXYCODONE HCL 5 MG PO TABS
5.0000 mg | ORAL_TABLET | ORAL | Status: DC | PRN
  Administered 2013-08-06 – 2013-08-07 (×5): 5 mg via ORAL
  Filled 2013-08-06 (×5): qty 1

## 2013-08-06 MED ORDER — ONDANSETRON HCL 4 MG/2ML IJ SOLN
INTRAMUSCULAR | Status: DC | PRN
  Administered 2013-08-06: 4 mg via INTRAVENOUS

## 2013-08-06 MED ORDER — OXYCODONE HCL 5 MG/5ML PO SOLN: 5.0000 mg | Freq: Once | ORAL | Status: AC | PRN

## 2013-08-06 MED ORDER — BUTALBITAL-APAP-CAFFEINE 50-325-40 MG PO TABS
1.0000 | ORAL_TABLET | Freq: Four times a day (QID) | ORAL | Status: DC | PRN
  Administered 2013-08-06 – 2013-08-07 (×2): 1 via ORAL
  Filled 2013-08-06 (×2): qty 1

## 2013-08-06 MED ORDER — LACTATED RINGERS IV SOLN
INTRAVENOUS | Status: DC | PRN
  Administered 2013-08-06 (×2): via INTRAVENOUS

## 2013-08-06 MED ORDER — NEOSTIGMINE METHYLSULFATE 10 MG/10ML IV SOLN
INTRAVENOUS | Status: DC | PRN
  Administered 2013-08-06: 4 mg via INTRAVENOUS

## 2013-08-06 MED ORDER — PROPOFOL 10 MG/ML IV BOLUS
INTRAVENOUS | Status: DC | PRN
  Administered 2013-08-06 (×2): 20 mg via INTRAVENOUS
  Administered 2013-08-06: 180 mg via INTRAVENOUS

## 2013-08-06 MED ORDER — SODIUM CHLORIDE 0.9 % IJ SOLN
3.0000 mL | Freq: Two times a day (BID) | INTRAMUSCULAR | Status: DC
  Administered 2013-08-06 (×2): 3 mL via INTRAVENOUS

## 2013-08-06 MED ORDER — FENTANYL CITRATE 0.05 MG/ML IJ SOLN
INTRAMUSCULAR | Status: DC | PRN
  Administered 2013-08-06 (×5): 50 ug via INTRAVENOUS
  Administered 2013-08-06: 100 ug via INTRAVENOUS
  Administered 2013-08-06: 50 ug via INTRAVENOUS

## 2013-08-06 MED ORDER — DEXAMETHASONE SODIUM PHOSPHATE 10 MG/ML IJ SOLN
INTRAMUSCULAR | Status: DC | PRN
  Administered 2013-08-06: 4 mg via INTRAVENOUS

## 2013-08-06 MED ORDER — ACETAMINOPHEN 325 MG PO TABS: 650.0000 mg | ORAL_TABLET | ORAL | Status: DC | PRN

## 2013-08-06 MED ORDER — CYCLOBENZAPRINE HCL 10 MG PO TABS
10.0000 mg | ORAL_TABLET | Freq: Three times a day (TID) | ORAL | Status: DC | PRN
  Administered 2013-08-06 – 2013-08-07 (×3): 10 mg via ORAL
  Filled 2013-08-06 (×2): qty 1

## 2013-08-06 MED ORDER — HYDROMORPHONE HCL PF 1 MG/ML IJ SOLN
0.5000 mg | INTRAMUSCULAR | Status: DC | PRN
  Administered 2013-08-06: 1 mg via INTRAVENOUS
  Filled 2013-08-06: qty 1

## 2013-08-06 MED ORDER — PHENYLEPHRINE 40 MCG/ML (10ML) SYRINGE FOR IV PUSH (FOR BLOOD PRESSURE SUPPORT)
PREFILLED_SYRINGE | INTRAVENOUS | Status: AC
  Filled 2013-08-06: qty 10

## 2013-08-06 SURGICAL SUPPLY — 63 items
APL SKNCLS STERI-STRIP NONHPOA (GAUZE/BANDAGES/DRESSINGS) ×1
BAG DECANTER FOR FLEXI CONT (MISCELLANEOUS) ×3 IMPLANT
BENZOIN TINCTURE PRP APPL 2/3 (GAUZE/BANDAGES/DRESSINGS) ×3 IMPLANT
BLADE 10 SAFETY STRL DISP (BLADE) ×1 IMPLANT
BRUSH SCRUB EZ PLAIN DRY (MISCELLANEOUS) ×3 IMPLANT
BUR MATCHSTICK NEURO 3.0 LAGG (BURR) ×3 IMPLANT
CAGE PEEK 5X14X11 (Cage) ×4 IMPLANT
CAGE PEEK 6X14X11 (Cage) ×3 IMPLANT
CAGE SPNL 11X14X6XRADOPQ (Cage) IMPLANT
CANISTER SUCT 3000ML (MISCELLANEOUS) ×3 IMPLANT
CLOSURE WOUND 1/2 X4 (GAUZE/BANDAGES/DRESSINGS) ×1
CONT SPEC 4OZ CLIKSEAL STRL BL (MISCELLANEOUS) ×3 IMPLANT
DRAPE C-ARM 42X72 X-RAY (DRAPES) ×6 IMPLANT
DRAPE LAPAROTOMY 100X72 PEDS (DRAPES) ×3 IMPLANT
DRAPE MICROSCOPE ZEISS OPMI (DRAPES) ×3 IMPLANT
DRAPE POUCH INSTRU U-SHP 10X18 (DRAPES) ×3 IMPLANT
DRILL BIT (BIT) ×2 IMPLANT
DURAPREP 6ML APPLICATOR 50/CS (WOUND CARE) ×3 IMPLANT
ELECT COATED BLADE 2.86 ST (ELECTRODE) ×3 IMPLANT
ELECT REM PT RETURN 9FT ADLT (ELECTROSURGICAL) ×3
ELECTRODE REM PT RTRN 9FT ADLT (ELECTROSURGICAL) ×1 IMPLANT
GAUZE SPONGE 4X4 16PLY XRAY LF (GAUZE/BANDAGES/DRESSINGS) IMPLANT
GLOVE BIO SURGEON STRL SZ7 (GLOVE) ×2 IMPLANT
GLOVE BIOGEL PI IND STRL 7.0 (GLOVE) IMPLANT
GLOVE BIOGEL PI INDICATOR 7.0 (GLOVE) ×4
GLOVE ECLIPSE 9.0 STRL (GLOVE) ×3 IMPLANT
GLOVE EXAM NITRILE LRG STRL (GLOVE) IMPLANT
GLOVE EXAM NITRILE MD LF STRL (GLOVE) IMPLANT
GLOVE EXAM NITRILE XL STR (GLOVE) IMPLANT
GLOVE EXAM NITRILE XS STR PU (GLOVE) IMPLANT
GLOVE OPTIFIT SS 6.5 STRL BRWN (GLOVE) ×6 IMPLANT
GOWN STRL REUS W/ TWL LRG LVL3 (GOWN DISPOSABLE) IMPLANT
GOWN STRL REUS W/ TWL XL LVL3 (GOWN DISPOSABLE) IMPLANT
GOWN STRL REUS W/TWL 2XL LVL3 (GOWN DISPOSABLE) IMPLANT
GOWN STRL REUS W/TWL LRG LVL3 (GOWN DISPOSABLE) ×3
GOWN STRL REUS W/TWL XL LVL3 (GOWN DISPOSABLE) ×6
HALTER HD/CHIN CERV TRACTION D (MISCELLANEOUS) ×3 IMPLANT
KIT BASIN OR (CUSTOM PROCEDURE TRAY) ×3 IMPLANT
KIT ROOM TURNOVER OR (KITS) ×3 IMPLANT
NDL SPNL 20GX3.5 QUINCKE YW (NEEDLE) ×1 IMPLANT
NEEDLE SPNL 20GX3.5 QUINCKE YW (NEEDLE) ×3 IMPLANT
NS IRRIG 1000ML POUR BTL (IV SOLUTION) ×3 IMPLANT
PACK LAMINECTOMY NEURO (CUSTOM PROCEDURE TRAY) ×3 IMPLANT
PAD ARMBOARD 7.5X6 YLW CONV (MISCELLANEOUS) ×9 IMPLANT
PLATE ANT CERVICAL 52.5 (Plate) ×2 IMPLANT
PUTTY DBX 1CC (Putty) ×3 IMPLANT
PUTTY DBX 1CC DEPUY (Putty) IMPLANT
RUBBERBAND STERILE (MISCELLANEOUS) ×6 IMPLANT
SCREW ST FIX 4 ATL 3120213 (Screw) ×16 IMPLANT
SPONGE GAUZE 4X4 12PLY (GAUZE/BANDAGES/DRESSINGS) ×3 IMPLANT
SPONGE INTESTINAL PEANUT (DISPOSABLE) ×3 IMPLANT
SPONGE SURGIFOAM ABS GEL 100 (HEMOSTASIS) ×3 IMPLANT
STRIP CLOSURE SKIN 1/2X4 (GAUZE/BANDAGES/DRESSINGS) ×2 IMPLANT
SUT PDS AB 5-0 P3 18 (SUTURE) ×3 IMPLANT
SUT VIC AB 3-0 SH 8-18 (SUTURE) ×3 IMPLANT
SYR 20ML ECCENTRIC (SYRINGE) ×3 IMPLANT
TAPE CLOTH 4X10 WHT NS (GAUZE/BANDAGES/DRESSINGS) ×3 IMPLANT
TAPE CLOTH SURG 4X10 WHT LF (GAUZE/BANDAGES/DRESSINGS) ×2 IMPLANT
TAPE STRIPS DRAPE STRL (GAUZE/BANDAGES/DRESSINGS) ×2 IMPLANT
TOWEL OR 17X24 6PK STRL BLUE (TOWEL DISPOSABLE) ×3 IMPLANT
TOWEL OR 17X26 10 PK STRL BLUE (TOWEL DISPOSABLE) ×3 IMPLANT
TRAP SPECIMEN MUCOUS 40CC (MISCELLANEOUS) ×3 IMPLANT
WATER STERILE IRR 1000ML POUR (IV SOLUTION) ×3 IMPLANT

## 2013-08-06 NOTE — Anesthesia Preprocedure Evaluation (Addendum)
Anesthesia Evaluation  Patient identified by MRN, date of birth, ID band Patient awake    Reviewed: Allergy & Precautions, H&P , NPO status , Patient's Chart, lab work & pertinent test results, reviewed documented beta blocker date and time   Airway Mallampati: I  Neck ROM: Limited    Dental  (+) Teeth Intact, Dental Advisory Given   Pulmonary Current Smoker,  breath sounds clear to auscultation        Cardiovascular Rhythm:Regular Rate:Normal  Borderline HTN , doesn't treat   Neuro/Psych  Headaches, Anxiety    GI/Hepatic negative GI ROS, Neg liver ROS,   Endo/Other  Morbid obesity  Renal/GU negative Renal ROS     Musculoskeletal negative musculoskeletal ROS (+)   Abdominal (+) + obese,  Abdomen: soft. Bowel sounds: normal.  Peds  Hematology negative hematology ROS (+)   Anesthesia Other Findings   Reproductive/Obstetrics negative OB ROS                        Anesthesia Physical Anesthesia Plan  ASA: II  Anesthesia Plan: General   Post-op Pain Management:    Induction: Intravenous  Airway Management Planned: Oral ETT  Additional Equipment:   Intra-op Plan:   Post-operative Plan: Extubation in OR  Informed Consent: I have reviewed the patients History and Physical, chart, labs and discussed the procedure including the risks, benefits and alternatives for the proposed anesthesia with the patient or authorized representative who has indicated his/her understanding and acceptance.     Plan Discussed with: CRNA and Surgeon  Anesthesia Plan Comments:         Anesthesia Quick Evaluation

## 2013-08-06 NOTE — Anesthesia Procedure Notes (Signed)
Procedure Name: Intubation Date/Time: 08/06/2013 7:48 AM Performed by: Ellin Goodie Pre-anesthesia Checklist: Patient identified, Emergency Drugs available, Suction available, Patient being monitored and Timeout performed Patient Re-evaluated:Patient Re-evaluated prior to inductionOxygen Delivery Method: Circle system utilized Preoxygenation: Pre-oxygenation with 100% oxygen Intubation Type: IV induction Ventilation: Mask ventilation without difficulty Laryngoscope Size: Mac and 3 Grade View: Grade II Tube type: Oral Tube size: 7.5 mm Number of attempts: 1 Airway Equipment and Method: Stylet Placement Confirmation: ETT inserted through vocal cords under direct vision,  positive ETCO2 and breath sounds checked- equal and bilateral Secured at: 22 cm Tube secured with: Tape Dental Injury: Teeth and Oropharynx as per pre-operative assessment  Comments: Easy atraumatic induction and intubation with MAC 3 blade.  Dr. Jacklynn Bue verified placement.  Carlynn Herald, CRNA

## 2013-08-06 NOTE — Anesthesia Postprocedure Evaluation (Signed)
  Anesthesia Post-op Note  Patient: Mariah Douglas  Procedure(s) Performed: Procedure(s) with comments: ANTERIOR CERVICAL DECOMPRESSION/DISCECTOMY FUSION 3 LEVELS (N/A) - ANTERIOR CERVICAL DECOMPRESSION/DISCECTOMY FUSION 3 LEVELS C4-5,5-6,6-7  Patient Location: PACU  Anesthesia Type:General  Level of Consciousness: awake and alert   Airway and Oxygen Therapy: Patient Spontanous Breathing  Post-op Pain: mild  Post-op Assessment: Post-op Vital signs reviewed  Post-op Vital Signs: stable  Last Vitals:  Filed Vitals:   08/06/13 1151  BP:   Pulse: 81  Temp:   Resp: 14    Complications: No apparent anesthesia complications

## 2013-08-06 NOTE — Progress Notes (Signed)
Orthopedic Tech Progress Note Patient Details:  Mariah Douglas Jul 18, 1960 [id number]  Ortho Devices Type of Ortho Device: Soft collar Ortho Device/Splint Interventions: Application   Mickie Bail Cammer 08/06/2013, 12:24 PM

## 2013-08-06 NOTE — H&P (Signed)
Mariah Douglas is an 53 y.o. female.   Chief Complaint: Neck and bilateral arm pain HPI: The patient is a 53 year old female with progressive neck and bilateral upper extremity pain. Initially she had severe pain into her right upper extremity particularly her right shoulder. Progressively she also had some pain radiating into her forearm and dorsum of her hand. Lately she's had severe symptoms of pain radiating to her left upper trimming and a reasonably classic left-sided C6 dermatomal distribution. Patient's failed conservative management including injections. Workup demonstrates evidence of marked spondylosis with stenosis of the left side and C5-6 with severe compression exiting C6 nerve root. Patient hasn't marked spondylosis and stenosis off to the right side at C6-7 with severe neuroforaminal stenosis on the right at C6-7. At C4-5 on the right the patient has evidence of neural foraminal stenosis with compression the exiting C5 nerve root. Patient presents now for three-level anterior cervical decompression infusion in hopes of improving her symptoms.  Past Medical History  Diagnosis Date  . Migraine   . IBS (irritable bowel syndrome)   . Family history of anesthesia complication     Daughter N/V    Past Surgical History  Procedure Laterality Date  . Back surgery      L5  . Colonoscopy      History reviewed. No pertinent family history. Social History:  reports that she has been smoking Cigarettes.  She has a 27 pack-year smoking history. She does not have any smokeless tobacco history on file. She reports that she does not drink alcohol or use illicit drugs.  Allergies:  Allergies  Allergen Reactions  . Hydrocodone-Acetaminophen Nausea And Vomiting  . Metronidazole Nausea And Vomiting  . Prednisone Other (See Comments)    Angry and agitated    Medications Prior to Admission  Medication Sig Dispense Refill  . Ascorbic Acid (VITAMIN C) 1000 MG tablet Take 1,000 mg by mouth  at bedtime.      . butalbital-aspirin-caffeine-codeine (ASCOMP-CODEINE) 50-325-40-30 MG capsule Take 1-2 capsules by mouth every 6 (six) hours as needed for migraine.      . cyclobenzaprine (FLEXERIL) 10 MG tablet Take 10 mg by mouth 2 (two) times daily as needed for muscle spasms.      . Multiple Vitamin (MULTIVITAMIN WITH MINERALS) TABS tablet Take 1 tablet by mouth at bedtime.      . tapentadol (NUCYNTA) 50 MG TABS tablet Take 50 mg by mouth 2 (two) times daily as needed for severe pain.      . traMADol (ULTRAM) 50 MG tablet Take 50 mg by mouth 3 (three) times daily as needed (pain).        Results for orders placed during the hospital encounter of 08/06/13 (from the past 48 hour(s))  BASIC METABOLIC PANEL     Status: Abnormal   Collection Time    08/05/13 10:37 AM      Result Value Ref Range   Sodium 142  137 - 147 mEq/L   Potassium 4.3  3.7 - 5.3 mEq/L   Chloride 103  96 - 112 mEq/L   CO2 27  19 - 32 mEq/L   Glucose, Bld 115 (*) 70 - 99 mg/dL   BUN 10  6 - 23 mg/dL   Creatinine, Ser 0.86  0.50 - 1.10 mg/dL   Calcium 10.0  8.4 - 10.5 mg/dL   GFR calc non Af Amer 76 (*) >90 mL/min   GFR calc Af Amer 88 (*) >90 mL/min   Comment: (NOTE)  The eGFR has been calculated using the CKD EPI equation.     This calculation has not been validated in all clinical situations.     eGFR's persistently <90 mL/min signify possible Chronic Kidney     Disease.  CBC WITH DIFFERENTIAL     Status: None   Collection Time    08/05/13 10:37 AM      Result Value Ref Range   WBC 7.9  4.0 - 10.5 K/uL   RBC 4.59  3.87 - 5.11 MIL/uL   Hemoglobin 14.1  12.0 - 15.0 g/dL   HCT 42.2  36.0 - 46.0 %   MCV 91.9  78.0 - 100.0 fL   MCH 30.7  26.0 - 34.0 pg   MCHC 33.4  30.0 - 36.0 g/dL   RDW 13.9  11.5 - 15.5 %   Platelets 198  150 - 400 K/uL   Neutrophils Relative % 59  43 - 77 %   Neutro Abs 4.6  1.7 - 7.7 K/uL   Lymphocytes Relative 30  12 - 46 %   Lymphs Abs 2.4  0.7 - 4.0 K/uL   Monocytes Relative 8   3 - 12 %   Monocytes Absolute 0.6  0.1 - 1.0 K/uL   Eosinophils Relative 3  0 - 5 %   Eosinophils Absolute 0.2  0.0 - 0.7 K/uL   Basophils Relative 0  0 - 1 %   Basophils Absolute 0.0  0.0 - 0.1 K/uL  SURGICAL PCR SCREEN     Status: None   Collection Time    08/05/13 10:37 AM      Result Value Ref Range   MRSA, PCR NEGATIVE  NEGATIVE   Staphylococcus aureus NEGATIVE  NEGATIVE   Comment:            The Xpert SA Assay (FDA     approved for NASAL specimens     in patients over 16 years of age),     is one component of     a comprehensive surveillance     program.  Test performance has     been validated by [employer] for patients greater     than or equal to 71 year old.     It is not intended     to diagnose infection nor to     guide or monitor treatment.  HCG, SERUM, QUALITATIVE     Status: None   Collection Time    08/05/13 10:37 AM      Result Value Ref Range   Preg, Serum NEGATIVE  NEGATIVE   Comment:            THE SENSITIVITY OF THIS     METHODOLOGY IS >10 mIU/mL.   No results found.  Review of Systems  Constitutional: Negative.   HENT: Negative.   Eyes: Negative.   Respiratory: Negative.   Cardiovascular: Negative.   Gastrointestinal: Negative.   Genitourinary: Negative.   Musculoskeletal: Negative.   Skin: Negative.   Neurological: Negative.   Endo/Heme/Allergies: Negative.   Psychiatric/Behavioral: Negative.     Blood pressure 139/86, pulse 74, temperature 97.7 F (36.5 C), temperature source Oral, height 5' (1.524 m), weight 94.802 kg (209 lb), last menstrual period 04/08/2013, SpO2 94.00%. Physical Exam  Constitutional: She is oriented to person, place, and time. She appears well-developed and well-nourished. No distress.  HENT:  Head: Normocephalic and atraumatic.  Right Ear: External ear normal.  Left Ear: External ear normal.  Nose:  Nose normal.  Mouth/Throat: Oropharynx is clear and moist. No oropharyngeal exudate.  Eyes: Conjunctivae  and EOM are normal. Pupils are equal, round, and reactive to light. Right eye exhibits no discharge. Left eye exhibits no discharge.  Neck: Normal range of motion. Neck supple. No tracheal deviation present. No thyromegaly present.  Cardiovascular: Normal rate, regular rhythm, normal heart sounds and intact distal pulses.  Exam reveals no friction rub.   No murmur heard. Respiratory: Effort normal and breath sounds normal. No respiratory distress. She has no wheezes.  GI: Soft. Bowel sounds are normal. She exhibits no distension. There is no tenderness.  Musculoskeletal: Normal range of motion. She exhibits no edema and no tenderness.  Neurological: She is alert and oriented to person, place, and time. She has normal reflexes. She displays normal reflexes. No cranial nerve deficit. She exhibits normal muscle tone. Coordination normal.  Skin: Skin is warm and dry. No rash noted. She is not diaphoretic. No erythema. No pallor.  Psychiatric: She has a normal mood and affect. Her behavior is normal. Judgment and thought content normal.     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 Plan C4-5, C5-6, C6-7 anterior cervical discectomy and interbody fusion utilizing peak interbody cage, local harvested autograft, and anterior plating instrumentation. Risks and benefits been explained. Patient wishes to proceed.  Mallie Mussel A Darielys Giglia 08/06/2013, 7:37 AM

## 2013-08-06 NOTE — Progress Notes (Signed)
Utilization review completed.  

## 2013-08-06 NOTE — Op Note (Signed)
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  Surgeon:Yarel Rushlow A.Akashdeep Chuba, M.D.  Asst. Surgeon: Venetia Maxon  Anesthesia: General  Indication: 53 year old female with persistent neck and bilateral upper extremity symptoms left greater than right. Workup demonstrates evidence of multilevel spondylosis with significant nerve root compression at C4-5, C5-6 and C6-7. Patient is failed conservative management including injections and presents now for three-level anterior decompression infusion in hopes of improving her symptoms.  Operative note: After induction of anesthesia, patient positioned supine with neck slightly extended and held in place with halter traction. Patient's anterior cervical region was prepped and draped sterilely. Incision made overlying the C5-6. Dissection proceeded down to the platysma. The platysma was divided vertically and dissection proceeded on the medial border of the sternocleidomastoid muscle and carotid sheath. Trachea and esophagus were mobilized and retracted towards the left. Prevertebral fascia was stripped within her spinal column. Longus colli muscles elevated bilaterally/Carter. Deep self-retaining traction placed intraoperative fluoroscopy was used and the levels were confirmed. Discectomies and performed at C4-5, C5-6 and C6-7 by first incising the disc and then removing disc using pituitary rongeurs forward and backward angle Carlen curettes Kerrison rongeurs high-speed drill. All minutes the disc were removed down to level of the posterior annulus. Microscope was then brought into the field and used throughout the remainder of the discectomy.  Remaining aspects of annulus and osteophytes removed using a high-speed drill down to the level posterior lateral ligament ligament. Posterior longitudinal limb was elevated and resected piecemeal fashion using Kerrison rongeurs. Underlying thecal sac was identified. Wide central decompression was then performed undercutting the bodies of C4 and C5. Decompression MCH are afraid are wide anterior foraminotomies were performed on course exiting C5 nerve roots bilaterally. This point a very thorough decompression been achieved. There is no evidence of injury to thecal sac and nerve roots. Procedure then repeated at C5-6 and C6-7 again without complication. Wound is then irrigated without like solution. Gelfoam was placed topically as needed for hemostasis. [employer] peak interbody cages were then sized and packed with locally harvested autograft and DBX putty. 3 cages were then impacted in place and recessed slightly from the anterior cortical margin from C4-C7. A 52 mm Atlantis translational plate was then placed over the C4, C5, C6, C7 levels and an attachment or fluoroscopic guidance using 13 mm fixed angle screws. All screws were tightened and and found to be solidly within the bone. Locking screws were engaged all 4 levels. Final images revealed good position the bone graft and hardware at proper upper level with normal lamina spine. Wounds that area one final time. Hemostasis was assured with bipolar chart was and close in layers with Vicryl sutures. Steri-Strips and sterile dressing were applied. There were no apparent outpatient. Patient tolerated the procedure well and she returns to the recovery room postop.

## 2013-08-06 NOTE — Plan of Care (Signed)
Problem: Consults Goal: Diagnosis - Spinal Surgery Outcome: Completed/Met Date Met:  08/06/13 Cervical Spine Fusion

## 2013-08-06 NOTE — Brief Op Note (Signed)
08/06/2013  10:36 AM  PATIENT:  Mariah Douglas  53 y.o. female  PRE-OPERATIVE DIAGNOSIS:  spondylosis  POST-OPERATIVE DIAGNOSIS:  spondylosis  PROCEDURE:  Procedure(s) with comments: ANTERIOR CERVICAL DECOMPRESSION/DISCECTOMY FUSION 3 LEVELS (N/A) - ANTERIOR CERVICAL DECOMPRESSION/DISCECTOMY FUSION 3 LEVELS C4-5,5-6,6-7  SURGEON:  Surgeon(s) and Role:    * Temple Pacini, MD - Primary    * Maeola Harman, MD - Assisting  PHYSICIAN ASSISTANT:   ASSISTANTS:    ANESTHESIA:   general  EBL:  Total I/O In: 2750 [I.V.:2500; IV Piggyback:250] Out: 455 [Urine:55; Blood:400]  BLOOD ADMINISTERED:none  DRAINS: (81mm) Jackson-Pratt drain(s) with closed bulb suction in the prevertebral space   LOCAL MEDICATIONS USED:  NONE  SPECIMEN:  No Specimen  DISPOSITION OF SPECIMEN:  N/A  COUNTS:  YES  TOURNIQUET:  * No tourniquets in log *  DICTATION: .Dragon Dictation  PLAN OF CARE: Admit to inpatient   PATIENT DISPOSITION:  PACU - hemodynamically stable.   Delay start of Pharmacological VTE agent (>24hrs) due to surgical blood loss or risk of bleeding: yes

## 2013-08-06 NOTE — Transfer of Care (Signed)
Immediate Anesthesia Transfer of Care Note  Patient: Mariah Douglas  Procedure(s) Performed: Procedure(s) with comments: ANTERIOR CERVICAL DECOMPRESSION/DISCECTOMY FUSION 3 LEVELS (N/A) - ANTERIOR CERVICAL DECOMPRESSION/DISCECTOMY FUSION 3 LEVELS C4-5,5-6,6-7  Patient Location: PACU  Anesthesia Type:General  Level of Consciousness: awake, alert  and oriented  Airway & Oxygen Therapy: Patient connected to face mask oxygen  Post-op Assessment: Report given to PACU RN  Post vital signs: stable  Complications: No apparent anesthesia complications

## 2013-08-07 MED ORDER — OXYCODONE HCL 5 MG PO TABS: 5.0000 mg | ORAL_TABLET | ORAL | Status: AC | PRN

## 2013-08-07 MED ORDER — DIAZEPAM 5 MG PO TABS: 5.0000 mg | ORAL_TABLET | Freq: Four times a day (QID) | ORAL | Status: DC | PRN

## 2013-08-07 NOTE — Progress Notes (Signed)
Pt. discharged home accompanied by husband. Prescriptions and discharge instructions given with verbalization of understanding. Incision site on neck with no s/s of infection - no swelling, redness, bleeding, and/or drainage noted. Soft collar intact. Pain med given just before leaving. Opportunity given to ask questions but no question asked. Pt. transported out of this unit in wheelchair by the volunteer. 

## 2013-08-07 NOTE — Discharge Instructions (Signed)

## 2013-08-07 NOTE — Discharge Summary (Signed)
Physician Discharge Summary  Patient ID: Mariah Douglas MRN: [id number] DOB/AGE: 52-Jul-1962 52 y.o.  Admit date: 08/06/2013 Discharge date: 08/07/2013  Admission Diagnoses:  Discharge Diagnoses:  Principal Problem:   Cervical spondylosis without myelopathy Active Problems:   Cervical spondylosis with radiculopathy   Discharged Condition: good  Hospital Course: The patient was admitted to the hospital where she underwent an uncomplicated three-level anterior cervical decompression and fusion. Postoperatively she is doing quite well. Neck and upper extremity pain significantly improved. Strength and sensation intact. Patient ready for discharge home.  Consults:   Significant Diagnostic Studies:   Treatments:   Discharge Exam: Blood pressure 167/81, pulse 97, temperature 99.2 F (37.3 C), temperature source Oral, resp. rate 18, height 5' (1.524 m), weight 94.802 kg (209 lb), last menstrual period 04/08/2013, SpO2 96.00%. Awake and alert. Oriented and appropriate. Cranial nerve function intact. Motor and sensory function extremities normal. Wound clean and dry. Chest and abdomen benign.   Disposition: 01-Home or Self Care  Discharge Instructions   Discontinue JP/Blake drain    Complete by:  As directed             Medication List         ASCOMP-CODEINE 50-325-40-30 MG capsule  Generic drug:  butalbital-aspirin-caffeine-codeine  Take 1-2 capsules by mouth every 6 (six) hours as needed for migraine.     cyclobenzaprine 10 MG tablet  Commonly known as:  FLEXERIL  Take 10 mg by mouth 2 (two) times daily as needed for muscle spasms.     diazepam 5 MG tablet  Commonly known as:  VALIUM  Take 1 tablet (5 mg total) by mouth every 6 (six) hours as needed for anxiety or muscle spasms.     multivitamin with minerals Tabs tablet  Take 1 tablet by mouth at bedtime.     NUCYNTA 50 MG Tabs tablet  Generic drug:  tapentadol  Take 50 mg by mouth 2 (two) times daily as needed for  severe pain.     oxyCODONE 5 MG immediate release tablet  Commonly known as:  Oxy IR/ROXICODONE  Take 1-2 tablets (5-10 mg total) by mouth every 3 (three) hours as needed for moderate pain.     traMADol 50 MG tablet  Commonly known as:  ULTRAM  Take 50 mg by mouth 3 (three) times daily as needed (pain).     vitamin C 1000 MG tablet  Take 1,000 mg by mouth at bedtime.         Signed: Artyom Stencel A 08/07/2013, 8:04 AM

## 2013-08-08 ENCOUNTER — Encounter (HOSPITAL_COMMUNITY): Payer: Self-pay | Admitting: Neurosurgery

## 2013-09-15 ENCOUNTER — Observation Stay: Payer: Self-pay

## 2013-09-15 LAB — BASIC METABOLIC PANEL
Anion Gap: 13 (ref 7–16)
BUN: 11 mg/dL (ref 7–18)
CALCIUM: 10.2 mg/dL — AB (ref 8.5–10.1)
CHLORIDE: 103 mmol/L (ref 98–107)
Co2: 21 mmol/L (ref 21–32)
Creatinine: 1.02 mg/dL (ref 0.60–1.30)
EGFR (African American): >60
Glucose: 154 mg/dL — ABNORMAL HIGH (ref 65–99)
OSMOLALITY: 276 (ref 275–301)
Potassium: 3.6 mmol/L (ref 3.5–5.1)
SODIUM: 137 mmol/L (ref 136–145)

## 2013-09-15 LAB — CBC
HCT: 41.3 % (ref 35.0–47.0)
HGB: 13.8 g/dL (ref 12.0–16.0)
MCH: 30.5 pg (ref 26.0–34.0)
MCHC: 33.4 g/dL (ref 32.0–36.0)
MCV: 91 fL (ref 80–100)
PLATELETS: 226 10*3/uL (ref 150–440)
RBC: 4.53 10*6/uL (ref 3.80–5.20)
RDW: 13.7 % (ref 11.5–14.5)
WBC: 10 10*3/uL (ref 3.6–11.0)

## 2013-09-15 LAB — HEPATIC FUNCTION PANEL A (ARMC)
ALT: 76 U/L (ref 12–78)
Albumin: 4.1 g/dL (ref 3.4–5.0)
Alkaline Phosphatase: 111 U/L
BILIRUBIN TOTAL: 0.2 mg/dL (ref 0.2–1.0)
SGOT(AST): 40 U/L — ABNORMAL HIGH (ref 15–37)
TOTAL PROTEIN: 8 g/dL (ref 6.4–8.2)

## 2013-09-15 LAB — APTT: Activated PTT: 35.6 secs (ref 23.6–35.9)

## 2013-09-15 LAB — PROTIME-INR
INR: 0.9
Prothrombin Time: 12.1 secs (ref 11.5–14.7)

## 2013-09-15 LAB — D-DIMER(ARMC): D-DIMER: 435 ng/mL

## 2013-09-15 LAB — LIPASE, BLOOD: Lipase: 94 U/L (ref 73–393)

## 2013-09-15 LAB — TROPONIN I: Troponin-I: 0.17 ng/mL — ABNORMAL HIGH

## 2013-09-17 LAB — PATHOLOGY REPORT

## 2013-12-13 ENCOUNTER — Other Ambulatory Visit: Payer: Self-pay

## 2014-01-23 ENCOUNTER — Emergency Department: Payer: Self-pay | Admitting: Emergency Medicine

## 2014-01-23 LAB — CBC
HCT: 40.3 % (ref 35.0–47.0)
HGB: 13.1 g/dL (ref 12.0–16.0)
MCH: 29.2 pg (ref 26.0–34.0)
MCHC: 32.6 g/dL (ref 32.0–36.0)
MCV: 90 fL (ref 80–100)
Platelet: 207 10*3/uL (ref 150–440)
RBC: 4.49 10*6/uL (ref 3.80–5.20)
RDW: 15.3 % — ABNORMAL HIGH (ref 11.5–14.5)
WBC: 11.3 10*3/uL — ABNORMAL HIGH (ref 3.6–11.0)

## 2014-01-23 LAB — BASIC METABOLIC PANEL
Anion Gap: 7 (ref 7–16)
BUN: 18 mg/dL (ref 7–18)
CALCIUM: 9.1 mg/dL (ref 8.5–10.1)
CREATININE: 1.15 mg/dL (ref 0.60–1.30)
Chloride: 103 mmol/L (ref 98–107)
Co2: 26 mmol/L (ref 21–32)
EGFR (African American): >60
GFR CALC NON AF AMER: 53 — AB
GLUCOSE: 133 mg/dL — AB (ref 65–99)
OSMOLALITY: 276 (ref 275–301)
Potassium: 3.7 mmol/L (ref 3.5–5.1)
SODIUM: 136 mmol/L (ref 136–145)

## 2014-01-23 LAB — TROPONIN I: Troponin-I: <0.02 ng/mL

## 2014-01-24 LAB — TROPONIN I: Troponin-I: <0.02 ng/mL

## 2014-04-09 ENCOUNTER — Emergency Department (HOSPITAL_COMMUNITY)
Admission: EM | Admit: 2014-04-09 | Discharge: 2014-04-09 | Disposition: A | Discharge status: Home / Self Care | Payer: 59 | Attending: Emergency Medicine | Admitting: Emergency Medicine

## 2014-04-09 ENCOUNTER — Encounter (HOSPITAL_COMMUNITY): Payer: Self-pay | Admitting: Emergency Medicine

## 2014-04-09 DIAGNOSIS — Z79899 Other long term (current) drug therapy: Secondary | ICD-10-CM | POA: Diagnosis not present

## 2014-04-09 DIAGNOSIS — R21 Rash and other nonspecific skin eruption: Secondary | ICD-10-CM | POA: Diagnosis present

## 2014-04-09 DIAGNOSIS — Z720 Tobacco use: Secondary | ICD-10-CM | POA: Diagnosis not present

## 2014-04-09 DIAGNOSIS — L219 Seborrheic dermatitis, unspecified: Secondary | ICD-10-CM | POA: Diagnosis not present

## 2014-04-09 DIAGNOSIS — Z8719 Personal history of other diseases of the digestive system: Secondary | ICD-10-CM | POA: Diagnosis not present

## 2014-04-09 DIAGNOSIS — G43909 Migraine, unspecified, not intractable, without status migrainosus: Secondary | ICD-10-CM | POA: Insufficient documentation

## 2014-04-09 MED ORDER — HYDROCORTISONE 1 % EX CREA: TOPICAL_CREAM | CUTANEOUS | Status: AC

## 2014-04-09 NOTE — Discharge Instructions (Signed)
Seborrheic Dermatitis Seborrheic dermatitis involves pink or red skin with greasy, flaky scales. It often occurs where there are more oil (sebaceous) glands. This condition is also known as dandruff. When this condition affects a baby's scalp, it is called cradle cap. It may come and go for no known reason. It can occur at any time of life from infancy to old age. TREATMENT  Cortisone (steroid) ointments, creams, and lotions can help decrease inflammation.  Babies can be treated with baby oil to soften the scales, then they may be washed with baby shampoo. If this does not help, a prescription topical steroid medicine may work.  Adults can use medicated shampoos.  Your caregiver may prescribe corticosteroid cream and shampoo containing an antifungal or yeast medicine (ketoconazole). Hydrocortisone or anti-yeast cream can be rubbed directly into seborrheic dermatitis patches. Yeast does not cause seborrheic dermatitis, but it seems to add to the problem.  In infants, do not aggressively remove the scales or flakes on the scalp with a comb or by other means. This may lead to hair loss. SEEK MEDICAL CARE IF:   The problem does not improve from the medicated shampoos, lotions, or other medicines given by your caregiver.  You have any other questions or concerns. Document Released: 03/24/2004 Document Revised: 08/16/2011 Document Reviewed: 07/06/2009 [facility] Patient Information 2015 [place], [place]. This information is not intended to replace advice given to you by your health care provider. Make sure you discuss any questions you have with your health care provider.   Emergency Department Resource Guide 1) Find a Doctor and Pay Out of Pocket Although you won't have to find out who is covered by your insurance plan, it is a good idea to ask around and get recommendations. You will then need to call the office and see if the doctor you have chosen will accept you as a new patient and what types of  options they offer for patients who are self-pay. Some doctors offer discounts or will set up payment plans for their patients who do not have insurance, but you will need to ask so you aren't surprised when you get to your appointment.  2) Contact Your Local Health Department Not all health departments have doctors that can see patients for sick visits, but many do, so it is worth a call to see if yours does. If you don't know where your local health department is, you can check in your phone book. The CDC also has a tool to help you locate your state's health department, and many state websites also have listings of all of their local health departments.  3) Find a Walk-in Clinic If your illness is not likely to be very severe or complicated, you may want to try a walk in clinic. These are popping up all over the country in pharmacies, drugstores, and shopping centers. They're usually staffed by nurse practitioners or physician assistants that have been trained to treat common illnesses and complaints. They're usually fairly quick and inexpensive. However, if you have serious medical issues or chronic medical problems, these are probably not your best option.  No Primary Care Doctor: - Call Health Connect at  [phone] - they can help you locate a primary care doctor that  accepts your insurance, provides certain services, etc. - Physician Referral Service- [phone]  Chronic Pain Problems: Organization         Address  Phone   Notes  [facility]  [phone] Patients need to be referred  by their primary care doctor.   Medication Assistance: Organization         Address  Phone   Notes  [facility] Medication [facility] [address] [place] [place], [place] [address] [phone] --Must be a resident of [facility] -- Must have NO insurance coverage whatsoever (no Medicaid/ Medicare, etc.) -- The pt. MUST have a primary care doctor that directs  their care regularly and follows them in the community   [facility]ist  [phone]   [employer]  [phone]    Agencies that provide inexpensive medical care: Organization         Address  Phone   Notes  [facility]  [phone]   [facility]    [phone]   [facility] [address] [place], [place] [address] [phone]   [facility] [address] [place] [place] [phone]   Planned Parenthood    [phone]   [facility]    [phone]   [facility]  [address], [place] Phone:  [phone], Fax:  [phone] Hours of Operation:  9 am - 6 pm, M-F.  Also accepts Medicaid/Medicare and self-pay.  [facility]  [address], [place] Phone: [phone], Fax: [phone]. Hours of Operation:  8:30 am - 5:30 pm, M-F.  Also accepts Medicaid and self-pay.  [facility] [place] [address] [place] Phone: [phone]   [facility] [address] Natasha Bence [place], [place] [phone], Ext. [phone] Mondays & Thursdays: 7-9 AM.  First 15 patients are seen on a first come, first serve basis.    Medicaid-accepting [facility] Providers:  Organization         Address  Phone   Notes  [facility] [address], [place] [phone] Also accepts self-pay patients.  [facility] [address] Laurell Josephs [place], [place]  [phone]   [facility] [address], [place] [phone]   [facility] [address], [place] [phone]   Renaye Rakers [address], [place]   [phone] Only accepts [place] patients after they have their name applied to their card.   Self-Pay (no insurance) in [facility]:  Organization          Address  Phone   Notes  Sickle Cell Patients, [facility] [address] [place], [place] [phone]   [facility] [address] [place], [place] [phone]   [facility] [place]  [address], [place] [phone]   [facility]/Dr. Osei-Bonsu  [address], [place] or [address], [place] [phone] Phone number for both [place] and [place] locations is the same.  [facility] [address], [place] [phone]   [facility] [address], [place] or [address] [phone]   [facility] [address] [place] [phone], phone; [phone], fax Sees patients 1st and 3rd Saturday of every month.  Must not qualify for public or private insurance (i.e. Medicaid, Medicare, [place] Health Choice, Veterans' Benefits)  Household income should be no more  than 200% of the poverty level The clinic cannot treat you if you are pregnant or think you are pregnant  Sexually transmitted diseases are not treated at the clinic.    Dental Care: Organization         Address  Phone  Notes  [facility] Department of [facility] [address] [place], [place] [phone] Accepts children up to age 60 who are enrolled in [place] or [place] Health Choice; pregnant women with a Medicaid card; and children who have applied for Medicaid or [place] Health Choice, but were declined, whose parents can pay a reduced fee at time of service.  [facility] Department of [facility]  [address], [place] [phone] Accepts children up to age 67 who are enrolled in [place] or [place] Health Choice; pregnant women with a Medicaid card; and children who have applied for Medicaid or [place] Health Choice, but were declined, whose parents can pay a reduced fee at time of  service.  [facility]  [address] [place], [place] [phone] Patients are seen by appointment only. Walk-ins are not accepted. [facility] will see patients 54 years of age and older. Monday - Tuesday (8am-5pm) Most Wednesdays (8:30-5pm) $30 per visit, cash only  [facility] Adult Dental Access PROGRAM  [address], [facility] [phone] Patients are seen by appointment only. Walk-ins are not accepted. [facility] will see patients 36 years of age and older. One Wednesday Evening (Monthly: Volunteer Based).  $30 per visit, cash only  [employer]  [phone] for adults; Children under age 54, call Graduate Pediatric Dentistry at [phone]. Children aged 42-14, please call [phone] to request a pediatric application.  Dental services are provided in all areas of dental care including fillings, crowns and bridges, complete and partial dentures, implants, gum treatment, root canals, and extractions. Preventive care is also provided. Treatment is provided to both adults and children. Patients are selected via a lottery and there is often a waiting list.   [facility] [address] [place]  [phone] [email or web address]   [facility] [address] [place], [place] [phone], Ext. [phone] Second and Fourth Thursday of each month, opens at 6:30 AM; Clinic ends at 9 AM.  Patients are seen on a first-come first-served basis, and a limited number are seen during each clinic.   [facility]  [address] Ether Griffins [place], [place] [phone]   Eligibility Requirements You must have lived in [place], [place], or [place] counties for at least the last three months.   You cannot be eligible for state or federal sponsored [place], including [employer], [place], or [employer].   You generally cannot be eligible for healthcare insurance through your employer.     How to apply: Eligibility screenings are held every Tuesday and Wednesday afternoon from 1:00 pm until 4:00 pm. You do not need an appointment for the interview!  [facility] [address] [place], [place] [phone]   [facility]  [phone]   [facility] Health Department  [phone]   [facility]  [phone]    Behavioral Health Resources in the Community: Intensive Outpatient Programs Organization         Address  Phone  Notes  [facility] [address], [place], [place] [phone]   [facility] [address], [place], [place] [phone]   [facility] [address], [place], [place]  [phone]   [facility] [address],  [place], [place] [phone] or [phone]   Substance Abuse Resources Organization         Address  Phone  Notes  [facility]  [phone]   [facility]  [phone]   [facility]  [phone]   [facility]  [phone]   Residential & Outpatient Substance Abuse Program  [phone]   Psychological Services Organization         Address  Phone  Notes  [facility]  [phone]- [phone]   [facility]  [phone]- [phone]   [facility] [address], [place] [phone] or [phone]    Mobile Crisis Teams Organization         Address  Phone  Notes  [facility], Mobile Crisis Care Unit  [phone]   [facility]  [address] [place], [place] [phone]   Doristine LocksSharon DeEsch [address] [place] [phone]    Self-Help/Support Groups Organization         Address  Phone             Notes  [facility] [place] - variety of support groups  336- [phone] Call for more information  Narcotics Anonymous (NA), [facility] [address], [place]  2 meetings at this location   Statisticianesidential Treatment Programs Organization         Address  Phone  Notes  [facility] [address],    [place]  [phone]   [facility]  [address], [place] [phone], [place], [place] [phone]   [facility] [address], [place] [phone] Admissions: 8am-3pm M-F  [facility] [address]    [place], [place] [phone]   [facility] [address], [place], [place] [phone]   [facility] [address]  [place], [place] [phone]   [facility] - Intensive Outpatient [address], [place], [place] [phone]   [facility] ([facility] [address]  [place], [place] [phone] or [phone]   [facility] ([facility]) [address] [place], [place] [phone] Accepts Medicaid  [facility] [address]  [place] [phone] Substance Abuse/Addiction Treatment   [facility] Health Resources Organization         Address  Phone  Notes  [facility]  [phone]   Angie FavaJulie Brannon, PhD [address], [address] [place], [place]   [phone] or [phone]   [facility]   [address] [place], [place] [phone]) [phone]   [facility] [address], [place], [place] [phone] Insurance/Medicaid/sponsorship through [facility]  [facility] [address]                                    [place], [place] [phone] Therapy/tele-psych/case  [facility] [address].   [place], [place] [phone]    Dr. Lolly MustacheArfeen  [phone]) [phone]   [facility]  [employer] [facility] 1) [address], [place] 2) [address], [place] 3)  [address] [place]  [address], [place] [phone]  [phone]   [facility] Child Abuse  Hotline [phone] or [phone] (After Hours)

## 2014-04-09 NOTE — ED Notes (Signed)
Pt stated, My face is hot but no pain

## 2014-04-09 NOTE — ED Provider Notes (Signed)
CSN: [id number]     Arrival date & time 04/09/14  1758 History  This chart was scribed for Terri Piedra, PA-C, working with Lyanne Co, MD by Chestine Spore, ED Scribe. The patient was seen in room TR05C/TR05C at 6:38 PM.    Chief Complaint  Patient presents with  . Rash    The history is provided by the patient. No language interpreter was used.    HPI Comments: Mariah Douglas is a 54 y.o. female who presents to the Emergency Department complaining of rash onset 2 months. She reports that she has had dry patchy skin to her face and arms. She initially thought it was due to menopause. She reports that she had stopped her HCTZ and then she began it again and noticed more rash area. She notes that she d/c taking her HCTZ because she thought that it was the source of her rash. She reports that the majority of the redness has been on her face. She has had patches that were sporadically placed on her body. She states that she is having associated symptoms of red blotchy skin. She states that she has not tried any medications for the relief of her symptoms.  She denies using any new detergents, soaps, lotions, exposure to any pets. She denies any travel to anywhere, or new medications. She denies excessive showering, she bathes more and she stays in for 15 minutes. Denies being exposed to air or wind lately. She reports that she has recently quit smoking in June 2015. She denies eczema or dry skin. She reports that her face is typically oily, but not now.  She denies fever, chills, n/v, facial pain, and any other symptoms. She notes that her PCP is Clydie Braun, MD.    Past Medical History  Diagnosis Date  . Migraine   . IBS (irritable bowel syndrome)   . Family history of anesthesia complication     Daughter N/V   Past Surgical History  Procedure Laterality Date  . Back surgery      L5  . Colonoscopy    . Anterior cervical decomp/discectomy fusion N/A 08/06/2013    Procedure:  ANTERIOR CERVICAL DECOMPRESSION/DISCECTOMY FUSION 3 LEVELS;  Surgeon: Temple Pacini, MD;  Location: [facility];  Service: Neurosurgery;  Laterality: N/A;  ANTERIOR CERVICAL DECOMPRESSION/DISCECTOMY FUSION 3 LEVELS C4-5,5-6,6-7   No family history on file. History  Substance Use Topics  . Smoking status: Current Every Day Smoker -- 0.75 packs/day for 36 years    Types: Cigarettes  . Smokeless tobacco: Not on file  . Alcohol Use: No   OB History    No data available     Review of Systems  Constitutional: Negative for fever and chills.  Gastrointestinal: Negative for nausea and vomiting.  Skin: Positive for color change (redness to face) and rash.  All other systems reviewed and are negative.     Allergies  Hydrocodone-acetaminophen; Metronidazole; and Prednisone  Home Medications   Prior to Admission medications   Medication Sig Start Date End Date Taking? Authorizing Provider  Ascorbic Acid (VITAMIN C) 1000 MG tablet Take 1,000 mg by mouth at bedtime.    Historical Provider, MD  butalbital-aspirin-caffeine-codeine (ASCOMP-CODEINE) 50-325-40-30 MG capsule Take 1-2 capsules by mouth every 6 (six) hours as needed for migraine.    Historical Provider, MD  cyclobenzaprine (FLEXERIL) 10 MG tablet Take 10 mg by mouth 2 (two) times daily as needed for muscle spasms.    Historical Provider, MD  diazepam (VALIUM) 5  MG tablet Take 1 tablet (5 mg total) by mouth every 6 (six) hours as needed for anxiety or muscle spasms. 08/07/13   Temple PaciniHenry A Pool, MD  hydrocortisone cream 1 % Apply to affected area 2 times daily 04/09/14   Godwin Tedesco A Forcucci, PA-C  Multiple Vitamin (MULTIVITAMIN WITH MINERALS) TABS tablet Take 1 tablet by mouth at bedtime.    Historical Provider, MD  oxyCODONE (OXY IR/ROXICODONE) 5 MG immediate release tablet Take 1-2 tablets (5-10 mg total) by mouth every 3 (three) hours as needed for moderate pain. 08/07/13   Temple PaciniHenry A Pool, MD  tapentadol (NUCYNTA) 50 MG TABS tablet Take 50 mg  by mouth 2 (two) times daily as needed for severe pain.    Historical Provider, MD  traMADol (ULTRAM) 50 MG tablet Take 50 mg by mouth 3 (three) times daily as needed (pain).    Historical Provider, MD   BP 175/80 mmHg  Pulse 90  Temp(Src) 98.3 F (36.8 C) (Oral)  Resp 18  SpO2 98%  LMP 07/07/2013  Physical Exam  Constitutional: She is oriented to person, place, and time. She appears well-developed and well-nourished. No distress.  HENT:  Head: Normocephalic and atraumatic.  Right Ear: Tympanic membrane normal.  Left Ear: Tympanic membrane normal.  Mouth/Throat: Oropharynx is clear and moist.  Eyes: EOM are normal.  Neck: Neck supple. No tracheal deviation present.  Cardiovascular: Normal rate, regular rhythm and normal heart sounds.  Exam reveals no gallop and no friction rub.   No murmur heard. Pulmonary/Chest: Effort normal and breath sounds normal. No respiratory distress. She has no wheezes. She has no rhonchi. She has no rales.  Musculoskeletal: Normal range of motion.  Lymphadenopathy:    She has no cervical adenopathy.  Neurological: She is alert and oriented to person, place, and time.  Skin: Skin is warm and dry.  Skin is dry and xerotic on the extremities and face. There is some flaking above the right eyebrow and around the years. Rash on the face is dry to touch.  Psychiatric: She has a normal mood and affect. Her behavior is normal.  Nursing note and vitals reviewed.          ED Course  Procedures (including critical care time) DIAGNOSTIC STUDIES: Oxygen Saturation is 98% on room air, normal by my interpretation.    COORDINATION OF CARE: 6:52 PM-Discussed treatment plan which includes F/U with either PCP or dermatologist, began HCTZ  with pt at bedside and pt agreed to plan.   Labs Review Labs Reviewed - No data to display  Imaging Review No results found.   EKG Interpretation None      MDM   Final diagnoses:  Seborrheic dermatitis    Patient is a 54 year old female who presents emergency room for evaluation of rash. Skin is dry and cirrhotic on palpation. There is some flaking. Rash appears to be consistent with seborrheic dermatitis. We'll discharge home with hydrocortisone cream and Selsun Blue. Patient follow-up with dermatology, her PCP, or rheumatology. Patient to return for worsening rash, fevers, chills, nausea, vomiting or any other concerning symptoms. She states understanding and agreement at this time. Patient discussed with Dr. Patria Maneampos who agrees the above workup and plan.  I personally performed the services described in this documentation, which was scribed in my presence. The recorded information has been reviewed and is accurate.    Eben Burowourtney A Forcucci, PA-C 04/09/14 1931  Lyanne CoKevin M Campos, MD 04/09/14 [phone]
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
## 2014-06-21 NOTE — Op Note (Signed)
PATIENT NAME:  Mariah Douglas, Mariah Douglas MR#:  [id number] DATE OF BIRTH:  [birth year]  DATE OF PROCEDURE:  09/15/2013  Charlyne Mom Sopher on 09/15/2013.   PREOPERATIVE DIAGNOSES: Acute cholecystitis, cholelithiasis.   POSTOPERATIVE DIAGNOSES: Acute cholecystitis, cholelithiasis.   PROCEDURE: Laparoscopic cholecystectomy, cholangiogram.   SURGEON: Dr. Renda Rolls.   ANESTHESIA: General.   INDICATIONS: This 54 year old female came in emergently with severe right upper quadrant abdominal pain. She had ultrasound findings of gallstones with pericholecystic fluid. She had right upper quadrant tenderness. Surgery was recommended for definitive treatment.   DESCRIPTION OF PROCEDURE: The patient was placed on the operating table in the supine position under general endotracheal anesthesia. The abdomen was prepared with ChloraPrep and draped in a sterile manner.   A short incision was made in the inferior aspect of the umbilicus and carried down to the deep fascia which was grasped with laryngeal hook and elevated. A Veress needle was inserted, aspirated, and irrigated with a saline solution. Next, the peritoneal cavity was inflated with carbon dioxide. The Veress needle was removed. The 10-mm cannula was inserted. The 10-mm 0-degree laparoscope was inserted to view the peritoneal cavity. There was a typical appearance of a fatty liver. Also, there was distention of the gallbladder. The brief survey of the abdomen revealed typical appearance of the intestines and the small part of the uterus. There was a trace of clear colorless fluid in the pelvis. Next, another incision was made in the epigastrium, slightly to the right of the midline to introduce an 11-mm cannula. Two incisions were made in the lateral aspect of the right upper quadrant to introduce two 5-mm cannulas.   The patient was placed in the reverse Trendelenburg position and several degrees to the left. The Bookwalter mechanical arm was used  to hold the laparoscope. The gallbladder was found to be markedly distended. It was decompressed with a lancing needle suctioning some slightly green clear bile. The gallbladder was then retracted towards the right shoulder. The infundibulum was retracted inferiorly and laterally. The porta hepatis was demonstrated. Dissection was carried out along the neck of the gallbladder to isolate the cystic duct from surrounding structures. Also, the cystic artery was dissected free from surrounding structures. The gallbladder neck was mobilized with incision of the visceral peritoneum. A critical view of safety was demonstrated and Endo Clips were placed across the cystic artery proximally and distally and the cystic artery was divided. This led to better traction on the cystic duct. An Endo Clip was placed across the cystic duct adjacent to the gallbladder neck. An incision was made in the cystic duct to introduce a Reddick catheter. Half-strength Conray-60 dye was injected as the cholangiogram was done with fluoroscopy viewing the common bile duct and some reflux into the common hepatic duct and biliary radicals. There was prompt flow of dye into the duodenum, smooth tapered end of the common bile duct, and no retained stones. The Reddick catheter was removed. The cystic duct was doubly ligated with Endo Clips and divided. The gallbladder was dissected free from the liver with hook and cautery. There was edema in the gallbladder wall and this actually made the plane of dissection readily apparent. The gallbladder was completely separated from the liver. The site was irrigated with heparinized saline solution and aspirated. Hemostasis was intact. The gallbladder was delivered up through the infraumbilical incision, opened and suctioned and multiple stones were removed with the stone forceps and stone scoop. Many of these stones were approximately a centimeter in  dimension. The stones and gallbladder were submitted in  formalin for routine pathology. The right upper quadrant was further inspected. Hemostasis was intact. The cannulas were removed allowing carbon dioxide to escape from the peritoneal cavity. Several small subcutaneous bleeding points were cauterized. The wounds were closed with interrupted 5-0 chromic subcuticular suture, benzoin, and Steri-Strips. Dressings were applied with paper tape. The patient tolerated surgery satisfactorily and then was moved to the recovery room for postoperative care.    ____________________________ J. Renda RollsWilton Smith, MD jws:lt D: 09/15/2013 17:44:25 ET T: 09/15/2013 23:47:56 ET JOB#: [id number]  cc: Adella HareJ. Wilton Smith, MD, <Dictator> Adella HareWILTON J SMITH MD ELECTRONICALLY SIGNED 09/18/2013 9:08

## 2014-06-21 NOTE — H&P (Signed)
PATIENT NAME:  Mariah Douglas, Mariah Douglas MR#:  [id number] DATE OF BIRTH:  [birth year]  DATE OF ADMISSION:  09/15/2013  A 54 year old female presents with a right chest pain. It is nonexertional. It came on yesterday after eating supper after eating a cheeseburger. She has really not had this kind of pain before. She has no cardiac history. No family history of coronary artery disease. She recently stopped smoking and has been trying to lose weight. Her blood pressure has been elevated consistently and she has not taken her diuretic pill. Due to persistent pain she came to the ED where she was found to have right-sided pain with some right upper quadrant pain and will be admitted for further evaluation and treatment. Her troponin is 0.17.   PAST MEDICAL HISTORY AND MEDICAL ILLNESSES: Some migraine headaches, irritable bowel syndrome, cervical disk disease.   PAST SURGICAL HISTORY: Cervical fusion.   ALLERGIES: PREDNISONE AND VICODIN.   SOCIAL HISTORY: Married, two kids, has recently stop smoking. Occasional alcohol.   FAMILY HISTORY: Mother breast cancer. Father skin cancer.   REVIEW OF SYSTEM: Otherwise negative.   PHYSICAL EXAMINATION:  VITAL SIGNS: Blood pressure 200/100.  HEENT: Pupils equal and reactive.  NECK: No thyromegaly or bruits.  LUNGS: Clear to auscultation and percussion.  HEART: Regular rhythm. Chest wall tender in the axillary area right  chest just lateral to the breast.  ABDOMEN: Good bowel sounds. Soft, moderate right upper quadrant tenderness. No rebound.  EXTREMITIES: No edema.  SKIN: No rash.  NEUROLOGIC: Grossly nonfocal.   LABS: Normal with a white count of 10,000. Troponin 0.17.   ASSESSMENT AND PLAN: 1. Chest wall pain - doubt it is a pinched nerve. Does not track to her thoracic area. With the right upper quadrant pain we will evaluate for cholecystitis with right upper quadrant ultrasound, check cardiac enzymes. Ultimately, may need stress test depending on how the  the enzymes do, oxycodone as needed, p.o. Protonix to cover reflux possibility.  2. 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 Back on HCTZ, add Coreg 12.5 mg b.i.d. Follow blood pressures closely.  3. Tobacco abuse - recently stopped smoking. My concern is more likely gallbladder as she was about an hour to two hours post cheeseburger when this pain came on. Doubt pulmonary embolus, but the d-dimer is pending.    ____________________________ Danella PentonMark F. Juvon Teater, MD mfm:sg D: 09/15/2013 07:33:01 ET T: 09/15/2013 07:43:33 ET JOB#: [id number]  cc: Danella PentonMark F. Demarcus Thielke, MD, <Dictator> Rayyan Burley Sherlene ShamsF Allani Reber MD ELECTRONICALLY SIGNED 09/15/2013 10:28

## 2014-06-21 NOTE — H&P (Signed)
PATIENT NAME:  Mariah Douglas, Mariah Douglas MR#:  [id number] DATE OF BIRTH:  January 18, 1961  DATE OF ADMISSION:  09/15/2013  HISTORY OF PRESENT ILLNESS: This 54 year old female was admitted emergently and presents for general surgery consultation with a chief complaint of severe right upper abdominal and right lower lateral chest pain. She reports at the evening meal last night she had a hamburger, later in the evening developed this severe pain. She also had 2 episodes of vomiting and has had persistent nausea. She came into the Emergency Room where she was initially evaluated by the Emergency Room staff. There was initially some concern about possible coronary artery disease. She was also found to be hypertensive and did have some evaluation. Her troponin was 0.17.  Later she had an ultrasound which demonstrated gallstones and one stone appeared to be in the neck of the gallbladder. There was also a trace of pericholecystic fluid. She was found to have right upper quadrant tenderness and evidence of acute cholecystitis. She was treated with a bolus of heparin, but subsequently the heparin drip has been discontinued. She was also given a chewable aspirin. She was given carvedilol, also put on a clonidine patch, and also given promethazine and Zofran. She has p.r.n. hydralazine available.  She reports persistent pain which is some better after injection of morphine.   PAST MEDICAL HISTORY: Has included irritable bowel syndrome. Has been evaluated with colonoscopy which was normal. Also has had previous upper endoscopy. She reports no history of heart disease or lung disease. She has recently been found to have high blood pressure and was given a prescription for hydrochlorothiazide; however, she has not yet started that.  She also has a history of morbid obesity and recently has been eating less food and has lost some weight.  She has a history of smoking but did discontinue smoking in the early part of June.   PAST  SURGICAL HISTORY: She did have anterior cervical fusion which was done approximately June 9.  She has history of a lumbar mini-diskectomy.  Other than that she has had no abdominal surgery.  She did have the above-mentioned colonoscopy and upper endoscopy.   MEDICATIONS PRIOR TO ADMISSION: Cyclobenzaprine 10 mg 3 times a day, and also oxycodone 5 mg tablet available for her recent neck pain.   FAMILY HISTORY: Positive for hypertension, breast cancer.   SOCIAL HISTORY: She is accompanied by her husband and has 2 children. She stopped smoking before her recent neck surgery, she drinks a minimal amount of alcohol.   REVIEW OF SYSTEMS: The patient does report she has some chronic mild bilateral ankle edema. She has had no other recent acute illness such as cough, cold or sore throat. No recent visual changes. No difficulties breathing. No history of exertional chest pain. She reports she does have problems with irritable bowel and occasional constipation, occasional protracted diarrhea. She reports last menstrual period was approximately 6 months ago, she thinks she may be menopausal.  She reports no lower abdominal pain. She reports that she had had some radicular-type pain with her neck problem but that has markedly improved since her surgery. She continues to gradually recover from her neck surgery. Review of systems otherwise negative.   PHYSICAL EXAMINATION:  GENERAL: Height is 60 inches, weight is 198, BMI is 38.6. She was seen in the hospital bed, she continued to be in some distress with pain and nausea,  VITAL SIGNS:  Blood pressure at 9:00 was 191/112, pulse rate 90, respirations 18, temperature 98.1,  oxygen saturation 98%.  SKIN: Warm and dry without rash or jaundice.  HEENT: Pupils equal, reactive to light. Extraocular movements are intact. Sclerae clear without jaundice. Pharynx clear.  NECK: With some mild tenderness around her anterior cervical incision, although there is no swelling or  erythema. There is no adenopathy.  LUNGS: Sounds are clear with no respiratory distress.  HEART: Regular rhythm, S1 and S2 without murmur.  ABDOMEN: Obese. There is marked right upper quadrant tenderness with positive Murphy sign. No palpable mass. No hepatomegaly. No lower abdominal tenderness.  EXTREMITIES: No deep pedal edema.  NEUROLOGIC: Awake, alert, oriented, moving all extremities.   OTHER CLINICAL DATA: Her liver panel was ordered on lab work drawn during the night and her total bilirubin is 0.2, SGOT is elevated at 40. Troponin is 0.17, CBC with a white blood count 10,000, hemoglobin 13.8, prothrombin time was 12.1 seconds, PTT 35.6. Renal panel with a creatinine of 1.02 and glucose of 154.    Chest x-ray with minimal bibasilar atelectasis.   Ultrasound demonstrated multiple gallstones and 1 echogenic polyp 6 mm in dimension. There is a thin line of pericholecystic fluid between the gallbladder and the liver. Multiple gallstones were seen and one appears to be in the neck of the gallbladder. Common bile duct was 3.2 mm in diameter and there is borderline hepatomegaly. Extensive hepatic steatosis.   IMPRESSION:  1.  Acute cholecystitis, cholelithiasis.  2.  Hypertension.   I recommended laparoscopic cholecystectomy. I discussed the operation, care, risks and benefits with her in detail. I think with her ongoing pain and nausea it would be best to proceed with surgery today.  We are going to recheck her blood pressure now that she has the clonidine patch on.  We will allow some time for the administered heparin to wear off.    ____________________________ J. Renda RollsWilton Izan Miron, MD jws:lt D: 09/15/2013 11:08:48 ET T: 09/15/2013 11:45:16 ET JOB#: [id number]  cc: Adella HareJ. Wilton Jasreet Dickie, MD, <Dictator> Adella HareWILTON J Melissa Tomaselli MD ELECTRONICALLY SIGNED 09/15/2013 21:02

## 2014-11-10 ENCOUNTER — Other Ambulatory Visit: Payer: Self-pay | Admitting: Infectious Diseases

## 2014-11-10 DIAGNOSIS — Z1231 Encounter for screening mammogram for malignant neoplasm of breast: Secondary | ICD-10-CM

## 2014-11-24 ENCOUNTER — Ambulatory Visit
Admission: RE | Admit: 2014-11-24 | Discharge: 2014-11-24 | Disposition: A | Discharge status: Home / Self Care | Payer: 59 | Source: Ambulatory Visit | Attending: Infectious Diseases | Admitting: Infectious Diseases

## 2014-11-24 DIAGNOSIS — Z1231 Encounter for screening mammogram for malignant neoplasm of breast: Secondary | ICD-10-CM | POA: Insufficient documentation

## 2014-12-05 IMAGING — RF DG CERVICAL SPINE 1V
1 series · 1 of 1 positions shown · IV contrast (agent unspecified)
Comparison: None.

CLINICAL DATA: 53-year-old female undergoing cervical spine
surgery. Initial encounter.

EXAM:
DG C-ARM 61-120 MIN; DG CERVICAL SPINE - 1 VIEW
TECHNIQUE: Single intraoperative fluoroscopic cross-table lateral view of the
cervical spine.
CONTRAST:  None.
FLUOROSCOPY TIME:  0 min in 4 seconds.

[Series 1: run · 1 of 1 slices shown]
[im 1/1]
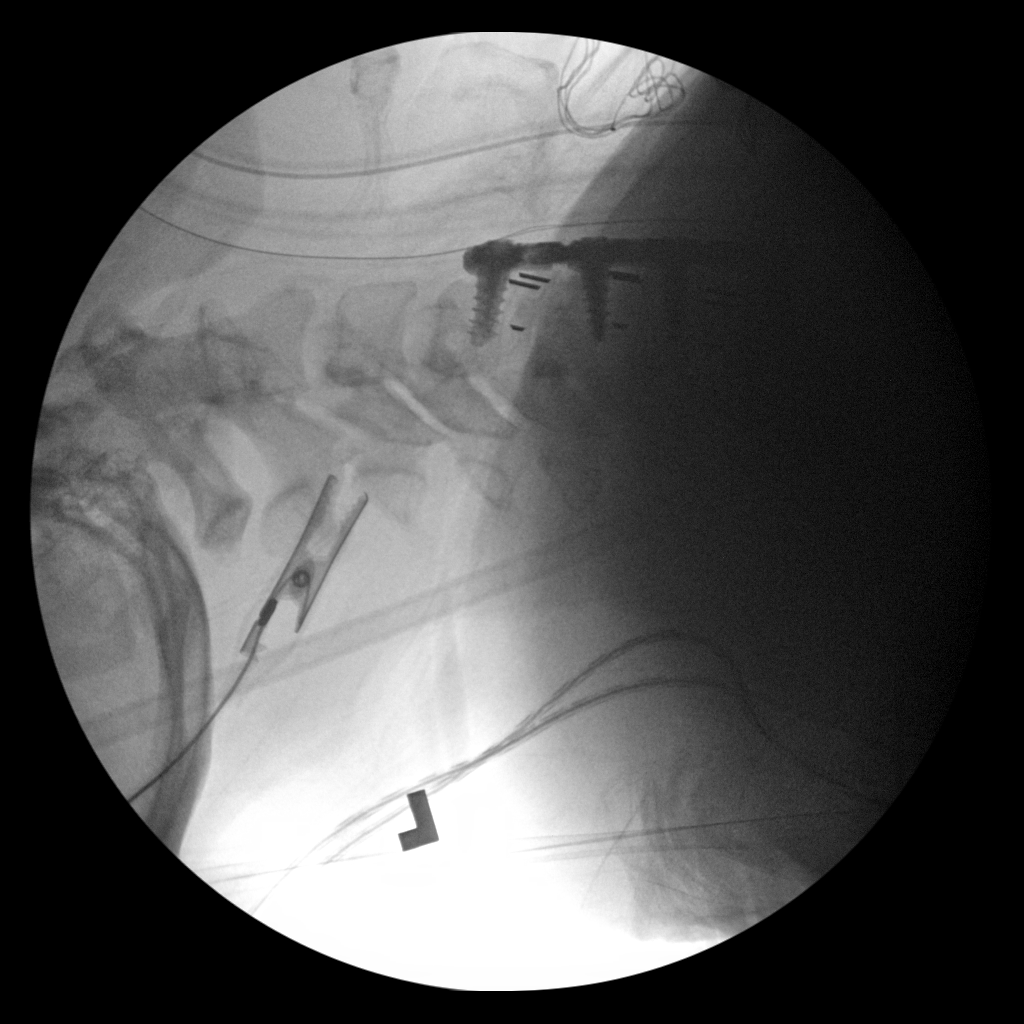

[1 of 1 positions shown; findings below may reference images not displayed]

FINDINGS: C4-C5, C5-C6, and C6-C7 ACDF hardware is in place.
IMPRESSION: Cervical ACDF as above.

## 2015-03-28 ENCOUNTER — Encounter: Payer: Self-pay | Admitting: Emergency Medicine

## 2015-03-28 ENCOUNTER — Emergency Department
Admission: EM | Admit: 2015-03-28 | Discharge: 2015-03-28 | Disposition: A | Discharge status: Home / Self Care | Payer: Worker's Compensation | Attending: Emergency Medicine | Admitting: Emergency Medicine

## 2015-03-28 DIAGNOSIS — Y9289 Other specified places as the place of occurrence of the external cause: Secondary | ICD-10-CM | POA: Insufficient documentation

## 2015-03-28 DIAGNOSIS — W208XXA Other cause of strike by thrown, projected or falling object, initial encounter: Secondary | ICD-10-CM | POA: Diagnosis not present

## 2015-03-28 DIAGNOSIS — Z79899 Other long term (current) drug therapy: Secondary | ICD-10-CM | POA: Diagnosis not present

## 2015-03-28 DIAGNOSIS — Z7952 Long term (current) use of systemic steroids: Secondary | ICD-10-CM | POA: Diagnosis not present

## 2015-03-28 DIAGNOSIS — Y9389 Activity, other specified: Secondary | ICD-10-CM | POA: Diagnosis not present

## 2015-03-28 DIAGNOSIS — S60221A Contusion of right hand, initial encounter: Secondary | ICD-10-CM | POA: Diagnosis not present

## 2015-03-28 DIAGNOSIS — S6991XA Unspecified injury of right wrist, hand and finger(s), initial encounter: Secondary | ICD-10-CM | POA: Diagnosis present

## 2015-03-28 DIAGNOSIS — Y990 Civilian activity done for income or pay: Secondary | ICD-10-CM | POA: Insufficient documentation

## 2015-03-28 DIAGNOSIS — F17210 Nicotine dependence, cigarettes, uncomplicated: Secondary | ICD-10-CM | POA: Diagnosis not present

## 2015-03-28 MED ORDER — NAPROXEN 500 MG PO TABS: 500.0000 mg | ORAL_TABLET | Freq: Two times a day (BID) | ORAL | Status: DC

## 2015-03-28 NOTE — ED Notes (Signed)
Reports the soap dispenser at work fell off the wall while she was trying to get soap and hit her right palm.  Reports pain. No deformity noted.

## 2015-03-28 NOTE — ED Provider Notes (Signed)
[facility] Emergency Department Provider Note  ____________________________________________  Time seen: Approximately 2:29 PM  I have reviewed the triage vital signs and the nursing notes.   HISTORY  Chief Complaint Hand Injury    HPI Mariah Douglas is a 55 y.o. female who presents emergency department complaining of right hand pain. She states that she was using the soap dispenser at work when it fell off the wall and struck her palm. She states that she is having bruising and pain to the thenar eminence. She denies any loss of range of motion. She denies any numbness or tingling. She is not taking any medications prior to arrival.   Past Medical History  Diagnosis Date  . Migraine   . IBS (irritable bowel syndrome)   . Family history of anesthesia complication     Daughter N/V    Patient Active Problem List   Diagnosis Date Noted  . Cervical spondylosis without myelopathy 08/06/2013  . Cervical spondylosis with radiculopathy 08/06/2013  . VIRAL INFECTION, ACUTE 08/13/2007  . TOBACCO USE 03/02/2007  . SINUSITIS- ACUTE-NOS 03/02/2007    Past Surgical History  Procedure Laterality Date  . Back surgery      L5  . Colonoscopy    . Anterior cervical decomp/discectomy fusion N/A 08/06/2013    Procedure: ANTERIOR CERVICAL DECOMPRESSION/DISCECTOMY FUSION 3 LEVELS;  Surgeon: Temple Pacini, MD;  Location: [facility]UR[facility] OR[facility];  Service: Neurosurgery;  Laterality: N/A;  ANTERIOR CERVICAL DECOMPRESSION/DISCECTOMY FUSION 3 LEVELS C4-5,5-6,6-7  . Breast cyst aspiration Left 2004    Current Outpatient Rx  Name  Route  Sig  Dispense  Refill  . Ascorbic Acid (VITAMIN C) 1000 MG tablet   Oral   Take 1,000 mg by mouth at bedtime.         . butalbital-aspirin-caffeine-codeine (ASCOMP-CODEINE) 50-325-40-30 MG capsule   Oral   Take 1-2 capsules by mouth every 6 (six) hours as needed for migraine.         . cyclobenzaprine (FLEXERIL) 10 MG tablet   Oral   Take  10 mg by mouth 2 (two) times daily as needed for muscle spasms.         . diazepam (VALIUM) 5 MG tablet   Oral   Take 1 tablet (5 mg total) by mouth every 6 (six) hours as needed for anxiety or muscle spasms.   60 tablet   0   . hydrocortisone cream 1 %      Apply to affected area 2 times daily   15 g   0   . Multiple Vitamin (MULTIVITAMIN WITH MINERALS) TABS tablet   Oral   Take 1 tablet by mouth at bedtime.         . naproxen (NAPROSYN) 500 MG tablet   Oral   Take 1 tablet (500 mg total) by mouth 2 (two) times daily with a meal.   60 tablet   0   . oxyCODONE (OXY IR/ROXICODONE) 5 MG immediate release tablet   Oral   Take 1-2 tablets (5-10 mg total) by mouth every 3 (three) hours as needed for moderate pain.   80 tablet   0   . tapentadol (NUCYNTA) 50 MG TABS tablet   Oral   Take 50 mg by mouth 2 (two) times daily as needed for severe pain.         . traMADol (ULTRAM) 50 MG tablet   Oral   Take 50 mg by mouth 3 (three) times daily as needed (pain).  Allergies Hydrocodone-acetaminophen; Metronidazole; and Prednisone  Family History  Problem Relation Age of Onset  . Breast cancer Mother     late 62's  . Breast cancer Maternal Grandmother     late 76's    Social History Social History  Substance Use Topics  . Smoking status: Current Every Day Smoker -- 0.75 packs/day for 36 years    Types: Cigarettes  . Smokeless tobacco: None  . Alcohol Use: No     Review of Systems  Constitutional: No fever/chills Eyes: No visual changes. No discharge ENT: No sore throat. Cardiovascular: no chest pain. Respiratory: no cough. No SOB. Gastrointestinal: No abdominal pain.  No nausea, no vomiting.  No diarrhea.  No constipation. Genitourinary: Negative for dysuria. No hematuria Musculoskeletal: Negative for back pain. Positive for right hand pain. Skin: Negative for rash. Neurological: Negative for headaches, focal weakness or numbness. 10-point ROS  otherwise negative.  ____________________________________________   PHYSICAL EXAM:  VITAL SIGNS: ED Triage Vitals  Enc Vitals Group     BP 03/28/15 1401 130/76 mmHg     Pulse Rate 03/28/15 1401 87     Resp 03/28/15 1401 16     Temp 03/28/15 1401 98 F (36.7 C)     Temp Source 03/28/15 1401 Oral     SpO2 03/28/15 1401 98 %     Weight 03/28/15 1401 220 lb (99.791 kg)     Height 03/28/15 1401  (1.549 m)     Head Cir --      Peak Flow --      Pain Score 03/28/15 1401 3     Pain Loc --      Pain Edu? --      Excl. in GC? --      Constitutional: Alert and oriented. Well appearing and in no acute distress. Eyes: Conjunctivae are normal. PERRL. EOMI. Head: Atraumatic. ENT:      Ears:       Nose: No congestion/rhinnorhea.      Mouth/Throat: Mucous membranes are moist.  Neck: No stridor.   Hematological/Lymphatic/Immunilogical: No cervical lymphadenopathy. Cardiovascular: Normal rate, regular rhythm. Normal S1 and S2.  Good peripheral circulation. Respiratory: Normal respiratory effort without tachypnea or retractions. Lungs CTAB. Gastrointestinal: Soft and nontender. No distention. No CVA tenderness. Musculoskeletal: No lower extremity tenderness nor edema.  No joint effusions. No visible deformity, edema, or ecchymosis to her right hand when compared to left. Patient is mildly tender to palpation over the thenar eminence. No palpable abnormality. Full range of motion to the thumb and wrist. Capillary refill 5 digits. Sensation intact 5 digits. Negative Phalen's and Tinel's. Negative Finkelstein's. Neurologic:  Normal speech and language. No gross focal neurologic deficits are appreciated.  Skin:  Skin is warm, dry and intact. No rash noted. Psychiatric: Mood and affect are normal. Speech and behavior are normal. Patient exhibits appropriate insight and judgement.   ____________________________________________   LABS (all labs ordered are listed, but only abnormal  results are displayed)  Labs Reviewed - No data to display ____________________________________________  EKG   ____________________________________________  RADIOLOGY   No results found.  ____________________________________________    PROCEDURES  Procedure(s) performed:       Medications - No data to display   ____________________________________________   INITIAL IMPRESSION / ASSESSMENT AND PLAN / ED COURSE  Pertinent labs & imaging results that were available during my care of the patient were reviewed by me and considered in my medical decision making (see chart for details).  Patient's diagnosis is  consistent with right hand contusion. Patient's hand is placed in an Ace wrap for symptom control.. Patient will be discharged home with prescriptions for anti-inflammatories. Patient is to follow up with orthopedics if symptoms persist past this treatment course. Patient is given ED precautions to return to the ED for any worsening or new symptoms.     ____________________________________________  FINAL CLINICAL IMPRESSION(S) / ED DIAGNOSES  Final diagnoses:  Hand contusion, right, initial encounter      NEW MEDICATIONS STARTED DURING THIS VISIT:  New Prescriptions   NAPROXEN (NAPROSYN) 500 MG TABLET    Take 1 tablet (500 mg total) by mouth 2 (two) times daily with a meal.        Racheal Patches, PA-C 03/28/15 1444  Minna Antis, MD 03/28/15 1447

## 2015-03-28 NOTE — Discharge Instructions (Signed)
Hand Contusion  A hand contusion is a deep bruise on your hand area. Contusions are the result of an injury that caused bleeding under the skin. The contusion may turn blue, purple, or yellow. Minor injuries will give you a painless contusion, but more severe contusions may stay painful and swollen for a few weeks.  CAUSES   A contusion is usually caused by a blow, trauma, or direct force to an area of the body.  SYMPTOMS    Swelling and redness of the injured area.   Discoloration of the injured area.   Tenderness and soreness of the injured area.   Pain.  DIAGNOSIS   The diagnosis can be made by taking a history and performing a physical exam. An X-ray, CT scan, or MRI may be needed to determine if there were any associated injuries, such as broken bones (fractures).  TREATMENT   Often, the best treatment for a hand contusion is resting, elevating, icing, and applying cold compresses to the injured area. Over-the-counter medicines may also be recommended for pain control.  HOME CARE INSTRUCTIONS    Put ice on the injured area.    Put ice in a plastic bag.    Place a towel between your skin and the bag.    Leave the ice on for 15-20 minutes, 03-04 times a day.   Only take over-the-counter or prescription medicines as directed by your caregiver. Your caregiver may recommend avoiding anti-inflammatory medicines (aspirin, ibuprofen, and naproxen) for 48 hours because these medicines may increase bruising.   If told, use an elastic wrap as directed. This can help reduce swelling. You may remove the wrap for sleeping, showering, and bathing. If your fingers become numb, cold, or blue, take the wrap off and reapply it more loosely.   Elevate your hand with pillows to reduce swelling.   Avoid overusing your hand if it is painful.  SEEK IMMEDIATE MEDICAL CARE IF:    You have increased redness, swelling, or pain in your hand.   Your swelling or pain is not relieved with medicines.   You have loss of feeling in  your hand or are unable to move your fingers.   Your hand turns cold or blue.   You have pain when you move your fingers.   Your hand becomes warm to the touch.   Your contusion does not improve in 2 days.  MAKE SURE YOU:    Understand these instructions.   Will watch your condition.   Will get help right away if you are not doing well or get worse.     This information is not intended to replace advice given to you by your health care provider. Make sure you discuss any questions you have with your health care provider.     Document Released: 08/06/2001 Document Revised: 11/09/2011 Document Reviewed: 08/08/2011  Elsevier Interactive Patient Education 2016 [employer]

## 2015-03-28 NOTE — ED Notes (Signed)
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 RN & registration notified.

## 2015-04-02 MED ORDER — POTASSIUM CHLORIDE CRYS ER 20 MEQ PO TBCR
EXTENDED_RELEASE_TABLET | ORAL | Status: AC
  Filled 2015-04-02: qty 1

## 2015-05-24 IMAGING — CR DG CHEST 2V
1 series · 2 of 2 positions shown · non-contrast
Comparison: Chest radiograph September 15, 2013

CLINICAL DATA: RIGHT neck and chest pain with cough and shortness
of breath. Symptoms began a few hrs ago. History of neck surgery and
cholecystectomy.

EXAM:
CHEST  2 VIEW

[Series 1: w chest pa · 0.14mm/px · 2 of 2 slices shown]
[im 1/2]
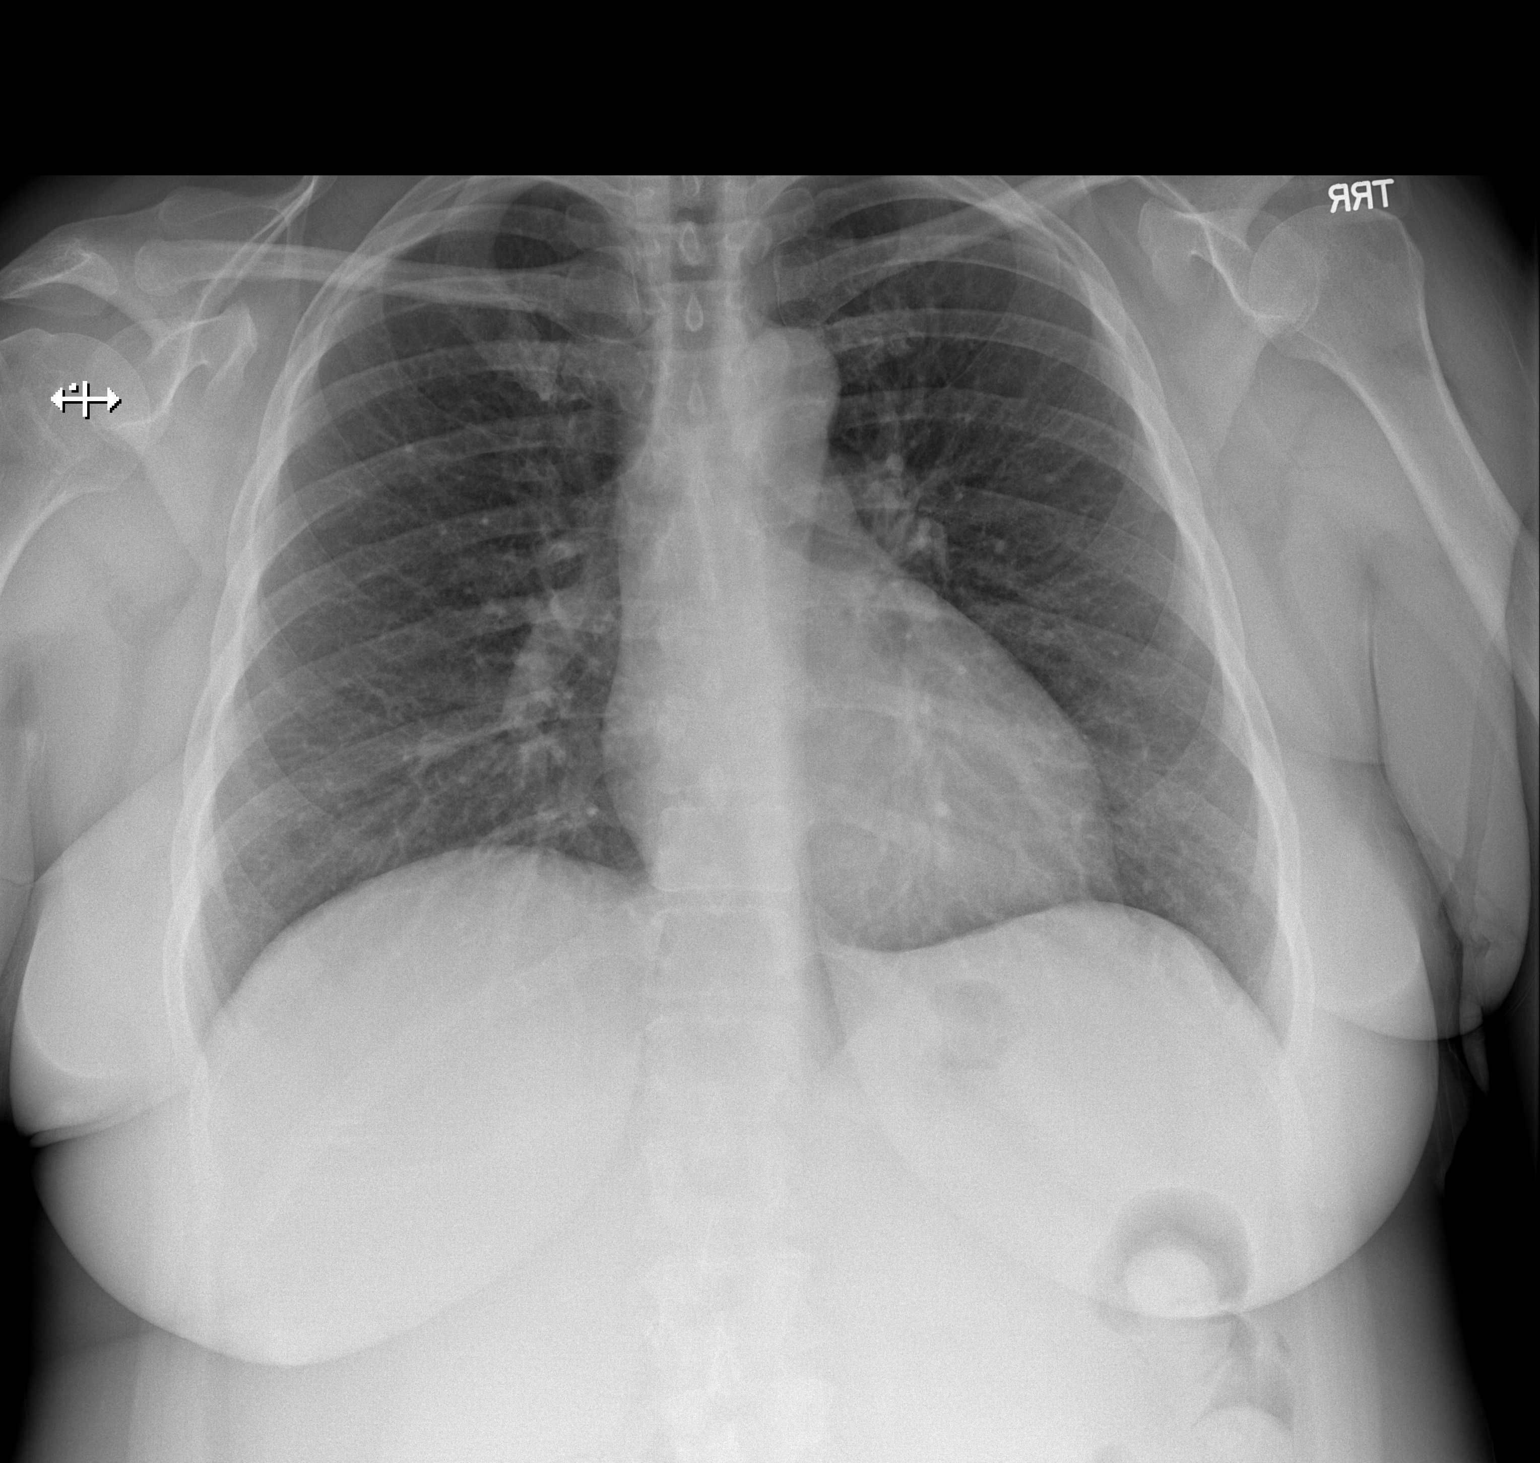
[im 2/2]
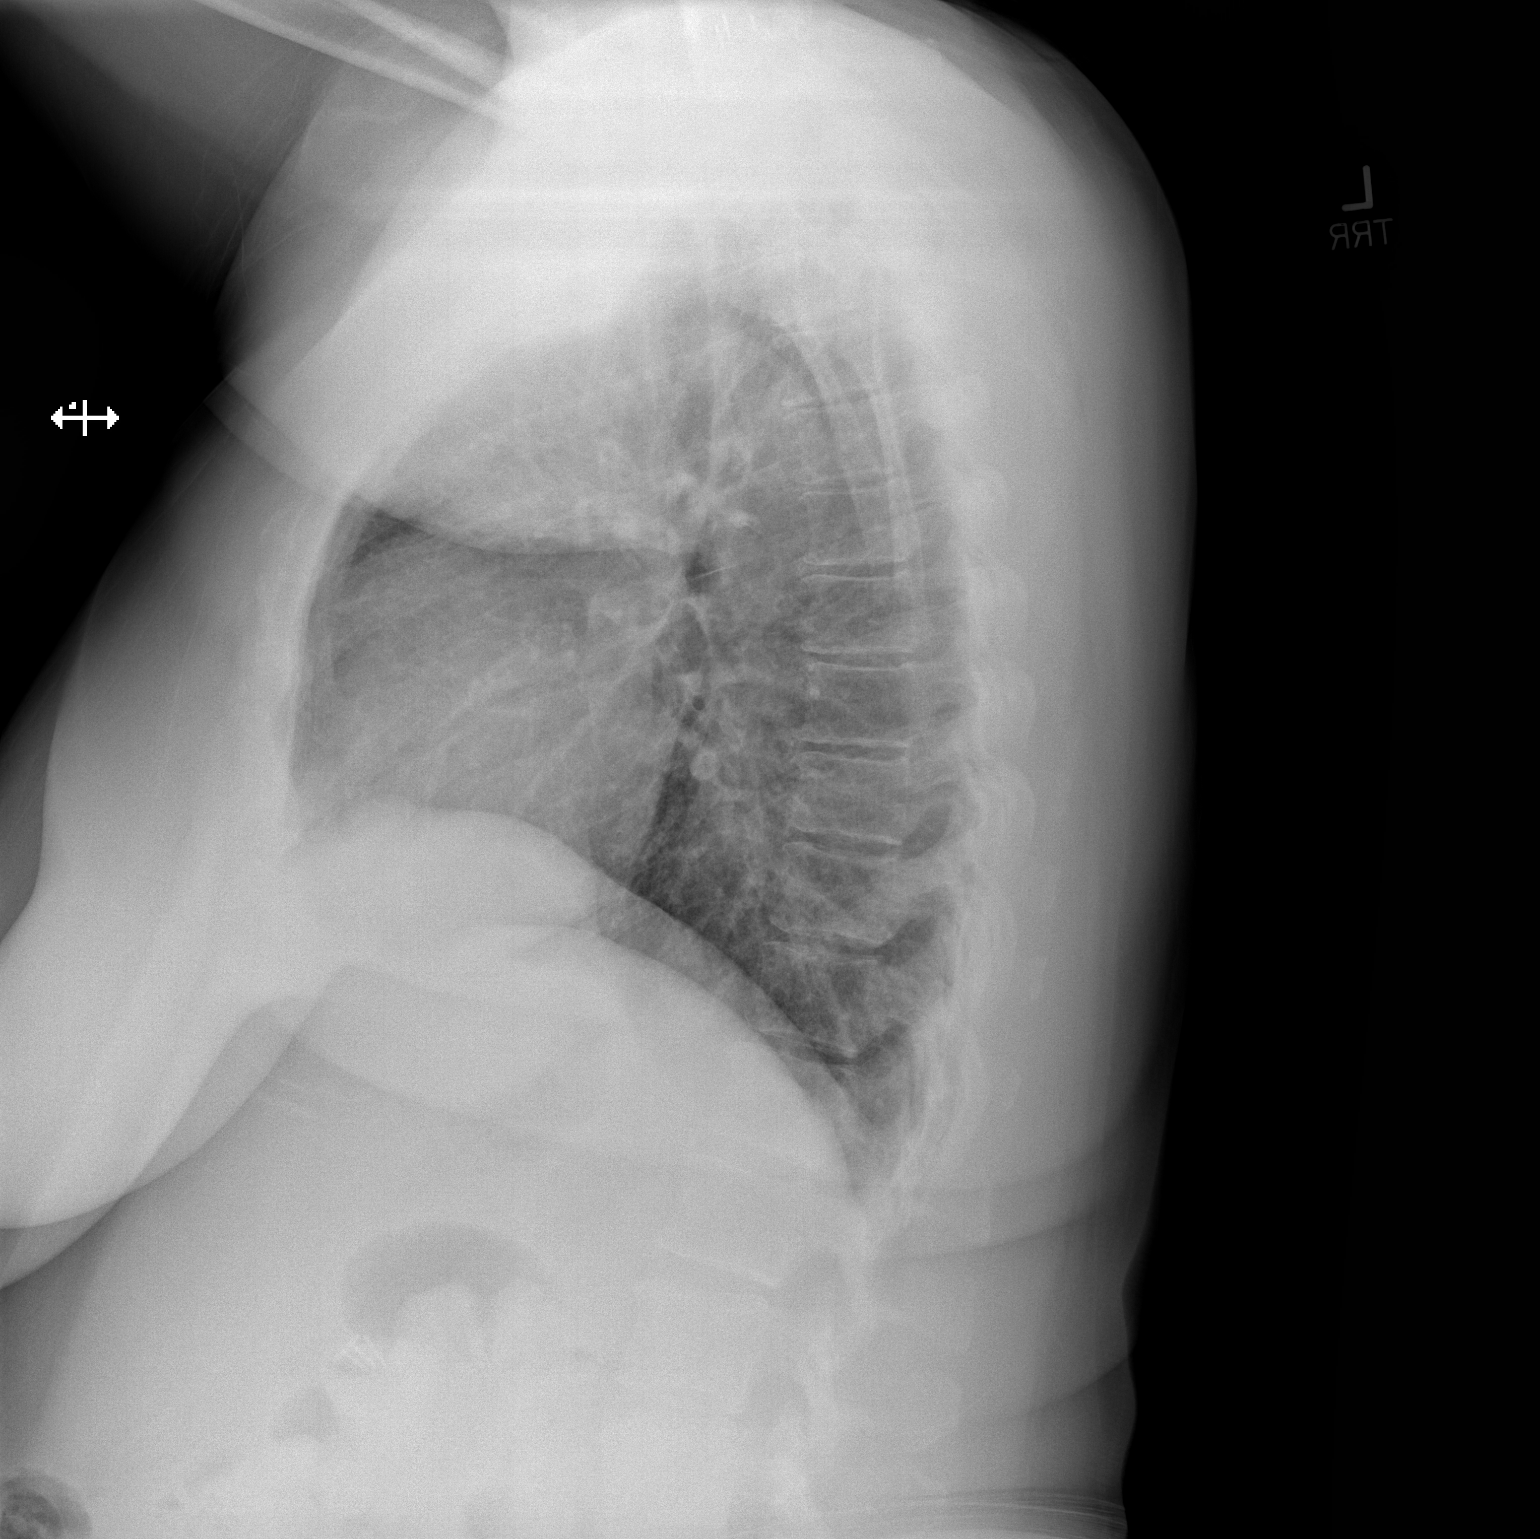

[2 of 2 positions shown; findings below may reference images not displayed]

FINDINGS: Cardiomediastinal silhouette is unremarkable. The lungs are clear
without pleural effusions or focal consolidations. Trachea projects
midline and there is no pneumothorax. Soft tissue planes and
included osseous structures are non-suspicious. ACDF. Surgical clips
in the included right abdomen likely reflect cholecystectomy.
IMPRESSION: No active cardiopulmonary disease.

  By: Ssahu Kasmah

## 2015-12-20 ENCOUNTER — Encounter (HOSPITAL_COMMUNITY): Payer: Self-pay | Admitting: *Deleted

## 2015-12-20 ENCOUNTER — Emergency Department (HOSPITAL_COMMUNITY): Payer: No Typology Code available for payment source

## 2015-12-20 ENCOUNTER — Emergency Department (HOSPITAL_COMMUNITY)
Admission: EM | Admit: 2015-12-20 | Discharge: 2015-12-20 | Disposition: A | Discharge status: Home / Self Care | Payer: No Typology Code available for payment source | Attending: Emergency Medicine | Admitting: Emergency Medicine

## 2015-12-20 DIAGNOSIS — F17210 Nicotine dependence, cigarettes, uncomplicated: Secondary | ICD-10-CM | POA: Insufficient documentation

## 2015-12-20 DIAGNOSIS — S20219A Contusion of unspecified front wall of thorax, initial encounter: Secondary | ICD-10-CM | POA: Diagnosis present

## 2015-12-20 DIAGNOSIS — R935 Abnormal findings on diagnostic imaging of other abdominal regions, including retroperitoneum: Secondary | ICD-10-CM | POA: Insufficient documentation

## 2015-12-20 DIAGNOSIS — S2001XA Contusion of right breast, initial encounter: Secondary | ICD-10-CM | POA: Diagnosis not present

## 2015-12-20 DIAGNOSIS — Y939 Activity, unspecified: Secondary | ICD-10-CM | POA: Diagnosis not present

## 2015-12-20 DIAGNOSIS — S8002XA Contusion of left knee, initial encounter: Secondary | ICD-10-CM | POA: Diagnosis not present

## 2015-12-20 DIAGNOSIS — Y92410 Unspecified street and highway as the place of occurrence of the external cause: Secondary | ICD-10-CM | POA: Insufficient documentation

## 2015-12-20 DIAGNOSIS — S1091XA Abrasion of unspecified part of neck, initial encounter: Secondary | ICD-10-CM | POA: Insufficient documentation

## 2015-12-20 DIAGNOSIS — R071 Chest pain on breathing: Secondary | ICD-10-CM | POA: Insufficient documentation

## 2015-12-20 DIAGNOSIS — Y999 Unspecified external cause status: Secondary | ICD-10-CM | POA: Diagnosis not present

## 2015-12-20 DIAGNOSIS — S30811A Abrasion of abdominal wall, initial encounter: Secondary | ICD-10-CM | POA: Insufficient documentation

## 2015-12-20 LAB — I-STAT CREATININE, ED: Creatinine, Ser: 0.7 mg/dL (ref 0.44–1.00)

## 2015-12-20 MED ORDER — OXYCODONE-ACETAMINOPHEN 5-325 MG PO TABS
2.0000 | ORAL_TABLET | Freq: Once | ORAL | Status: AC
  Administered 2015-12-20: 2 via ORAL
  Filled 2015-12-20: qty 2

## 2015-12-20 MED ORDER — IOPAMIDOL (ISOVUE-370) INJECTION 76%
100.0000 mL | Freq: Once | INTRAVENOUS | Status: AC | PRN
  Administered 2015-12-20: 100 mL via INTRAVENOUS

## 2015-12-20 MED ORDER — OXYCODONE-ACETAMINOPHEN 5-325 MG PO TABS: 1.0000 | ORAL_TABLET | Freq: Four times a day (QID) | ORAL | 0 refills | Status: DC | PRN

## 2015-12-20 MED ORDER — IOPAMIDOL (ISOVUE-300) INJECTION 61%: 100.0000 mL | Freq: Once | INTRAVENOUS | Status: DC | PRN

## 2015-12-20 MED ORDER — MORPHINE SULFATE (PF) 4 MG/ML IV SOLN
4.0000 mg | Freq: Once | INTRAVENOUS | Status: AC
  Administered 2015-12-20: 4 mg via INTRAVENOUS
  Filled 2015-12-20: qty 1
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
## 2015-12-20 NOTE — Discharge Instructions (Signed)
Medications: Percocet  Treatment: Take 1-2 Percocet every 4-6 hours for severe pain. Take this OR your oxycodone at home, do not take both. You can also take Flexeril as prescribed. Do not drive or operate machinery when taking these medications and only take as prescribed. Use ice for the first 2-3 days alternating 20 minutes on, 20 minutes off. After the third day he can use moist heat in the same manner and ice on your sore muscles. Do not use heat on your bruises, only ice.  Follow-up: Please follow-up with the primary care provider if your scheduled appointment on Wednesday. Please return to emergency department if you develop any new or worsening symptoms.

## 2016-01-06 ENCOUNTER — Encounter: Payer: Self-pay | Admitting: Physical Therapy

## 2016-01-06 ENCOUNTER — Ambulatory Visit: Payer: 59 | Attending: Family Medicine | Admitting: Physical Therapy

## 2016-01-06 DIAGNOSIS — S2001XS Contusion of right breast, sequela: Secondary | ICD-10-CM | POA: Insufficient documentation

## 2016-01-06 DIAGNOSIS — X58XXXS Exposure to other specified factors, sequela: Secondary | ICD-10-CM | POA: Diagnosis not present

## 2016-01-06 NOTE — Therapy (Signed)
[facility] [address] [place], [place], [address] Phone: [phone]   Fax:  [phone]  Physical Therapy Evaluation  Patient Details  Name: Mariah Douglas MRN: [id number] Date of Birth: 04-17-1960 No Data Recorded  Encounter Date: 01/06/2016      PT End of Session - 01/06/16 1358    Visit Number 1   Number of Visits 17   Date for PT Re-Evaluation 02/24/16   PT Start Time 0145   PT Stop Time 0245   PT Time Calculation (min) 60 min   Activity Tolerance Patient limited by pain   Behavior During Therapy [facility] for tasks assessed/performed      Past Medical History:  Diagnosis Date  . Family history of anesthesia complication    Daughter N/V  . IBS (irritable bowel syndrome)   . Migraine     Past Surgical History:  Procedure Laterality Date  . ANTERIOR CERVICAL DECOMP/DISCECTOMY FUSION N/A 08/06/2013   Procedure: ANTERIOR CERVICAL DECOMPRESSION/DISCECTOMY FUSION 3 LEVELS;  Surgeon: Temple PaciniHenry A Pool, MD;  Location: [facility];  Service: Neurosurgery;  Laterality: N/A;  ANTERIOR CERVICAL DECOMPRESSION/DISCECTOMY FUSION 3 LEVELS C4-5,5-6,6-7  . BACK SURGERY     L5  . BREAST CYST ASPIRATION Left 2004  . COLONOSCOPY      There were no vitals filed for this visit.       Subjective Assessment - 01/06/16 1358    Pertinent History cervical neck surgery discotomy 10/ 2015,    Patient Stated Goals to be able to lift, breathe and reaching      PAIN: Patient reports 5/10 pain to right chest ; patient reports having increased pain with RUE movements  POSTURE: WFL   PROM/AROM:WFL and limited by pain for AROM RUE 140 deg flex  STRENGTH:  Graded on a 0-5 scale Muscle Group Left Right  Shoulder flex 5/5 4/5  Shoulder Abd 5/5 4/5  Shoulder Ext 5/5 4/5  Shoulder IR/ER 5/5 4/5  Elbow 5/5 4/5  Wrist/hand 5/5 5/5                                       SENSATION: Right hand numbness and right fingers pins and  needles      FUNCTIONAL MOBILITY: independent   GAIT: Independent without AD  Patient was instructed in use of a pulley for HEP                             PT Education - 01/06/16 1356    Education provided Yes   Education Details plan of care   Person(s) Educated Patient   Methods Explanation   Comprehension Verbalized understanding                  Patient will benefit from skilled therapeutic intervention in order to improve the following deficits and impairments:     Visit Diagnosis: Contusion of right breast, sequela     Problem List Patient Active Problem List   Diagnosis Date Noted  . Cervical spondylosis without myelopathy 08/06/2013  . Cervical spondylosis with radiculopathy 08/06/2013  . VIRAL INFECTION, ACUTE 08/13/2007  . TOBACCO USE 03/02/2007  . SINUSITIS- ACUTE-NOS 03/02/2007    Jones BroomMansfield, Adhira Jamil S 01/06/2016, 2:06 PM  [facility] [address] [place], [place], [address] Phone: [phone]  Fax:  [phone]  Name: Mariah ShepherdDonna M Douglas MRN: [id number] Date of Birth: [birth year]

## 2016-01-13 ENCOUNTER — Ambulatory Visit: Payer: 59

## 2016-04-25 ENCOUNTER — Ambulatory Visit (INDEPENDENT_AMBULATORY_CARE_PROVIDER_SITE_OTHER): Payer: 59 | Admitting: Neurology

## 2016-04-25 ENCOUNTER — Encounter: Payer: Self-pay | Admitting: Neurology

## 2016-04-25 VITALS — BP 160/72 | HR 82 | Resp 18 | Ht 61.0 in | Wt 248.0 lb

## 2016-04-25 DIAGNOSIS — F1190 Opioid use, unspecified, uncomplicated: Secondary | ICD-10-CM | POA: Diagnosis not present

## 2016-04-25 DIAGNOSIS — R0683 Snoring: Secondary | ICD-10-CM | POA: Diagnosis not present

## 2016-04-25 DIAGNOSIS — E669 Obesity, unspecified: Secondary | ICD-10-CM
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
## 2016-05-02 ENCOUNTER — Telehealth: Payer: Self-pay | Admitting: Neurology

## 2016-05-02 DIAGNOSIS — G4700 Insomnia, unspecified: Secondary | ICD-10-CM

## 2016-05-02 DIAGNOSIS — R0683 Snoring: Secondary | ICD-10-CM

## 2016-05-02 DIAGNOSIS — G4730 Sleep apnea, unspecified: Principal | ICD-10-CM

## 2016-05-02 NOTE — Telephone Encounter (Signed)
We cannot start pt on auto pap without first doing a sleep study. Pt's split night study was denied. Would you like to order an HST?

## 2016-05-02 NOTE — Addendum Note (Signed)
Addended by: Melvyn NovasHMEIER, Tykiera Raven on: 05/02/2016 05:04 PM   Modules accepted: Orders

## 2016-05-02 NOTE — Addendum Note (Signed)
Addended by: Melvyn NovasHMEIER, Zethan Alfieri on: 05/02/2016 05:14 PM   Modules accepted: Orders

## 2016-05-02 NOTE — Telephone Encounter (Signed)
[employer] denied Split suggest HST.  Can I get an order for HST? °

## 2016-05-03 NOTE — Telephone Encounter (Signed)
Changed to HST  

## 2016-05-25 ENCOUNTER — Ambulatory Visit (INDEPENDENT_AMBULATORY_CARE_PROVIDER_SITE_OTHER): Payer: 59 | Admitting: Neurology

## 2016-05-25 DIAGNOSIS — F1190 Opioid use, unspecified, uncomplicated: Secondary | ICD-10-CM

## 2016-05-25 DIAGNOSIS — G4700 Insomnia, unspecified: Secondary | ICD-10-CM

## 2016-05-25 DIAGNOSIS — R0683 Snoring: Secondary | ICD-10-CM

## 2016-05-25 DIAGNOSIS — G4730 Sleep apnea, unspecified: Secondary | ICD-10-CM

## 2016-05-25 DIAGNOSIS — G4733 Obstructive sleep apnea (adult) (pediatric): Secondary | ICD-10-CM | POA: Diagnosis not present

## 2016-05-25 DIAGNOSIS — G8929 Other chronic pain: Secondary | ICD-10-CM

## 2016-05-25 DIAGNOSIS — G4701 Insomnia due to medical condition: Secondary | ICD-10-CM

## 2016-05-26 ENCOUNTER — Telehealth: Payer: Self-pay

## 2016-05-26 ENCOUNTER — Telehealth: Payer: Self-pay | Admitting: Neurology

## 2016-05-26 DIAGNOSIS — G4710 Hypersomnia, unspecified: Secondary | ICD-10-CM

## 2016-05-26 DIAGNOSIS — G4730 Sleep apnea, unspecified: Principal | ICD-10-CM

## 2016-05-26 NOTE — Telephone Encounter (Signed)
I called pt to discuss her sleep study results. No answer, left a message asking her to call me back. 

## 2016-05-26 NOTE — Telephone Encounter (Signed)
-----   Message from Melvyn Novasarmen Dohmeier, MD sent at 05/26/2016 12:23 PM EDT ----- STUDY RESULTS:  Total Recording Time: 7h 1740m Total Apnea/Hypopnea Index (AHI) 10.9/hrAverage Oxygen Saturation: SpO2 90% Nadir Oxygen Saturation: SpO2 71%, with 44 minutes hypoxemia time at or below 88% saturation. Average Heart Rate: 80 bpm.  IMPRESSION: Mild sleep apnea with AHI 10.9 but very loud snoring, RDI was 14.7/hr., desaturation index was 17.4/hr. Prolonged time in SpO2 desaturation. Please note that CO2 retention cannot be measured in HST, REM dependent apnea cannot be identified in a HST.  RECOMMENDATION: 1) Weight loss 2) Reduction in opioid medication overnight. 3) Return for attended sleep study with CPAP titration. CPAP therapy can improve hypoxemia where a dental device or ENT procedure cannot.

## 2016-05-26 NOTE — Telephone Encounter (Signed)
Please know that Zella BallRobin is working to see if an in lab CPAP can be authorized.

## 2016-05-26 NOTE — Procedures (Signed)
NAME: Mariah CrankerDonna M. Douglas   DOB: 19-Mar-1960 MEDICAL RECORD [id number]    DOS: 05/25/16 REFERRING PHYSICIAN: Dr. Joaquim NamAl Bartko           Study performed: HST/Out of Center Sleep test HISTORY: Dr. Murray HodgkinsBartko referred patient for snoring and pain medication induced apnea, obstructive or central in nature due super obesity (BMI exceeding 45), chronic pain with narcotic therapy for over 2 years.  Epworth score 8 points, Fatigue severity score 36 points, depression score 3/15 BMI 46.8.  STUDY RESULTS:  Total Recording Time: 7h 1686m Total Apnea/Hypopnea Index (AHI) 10.9/hr Average Oxygen Saturation: SpO2 90% Nadir Oxygen Saturation: SpO2 71%, with 44 minutes hypoxemia time at or below 88% saturation . Average Heart Rate: 80 bpm  IMPRESSION: Mild sleep apnea with AHI 10.9 but very loud snoring, RDI was 14.7/hr., desaturation index was 17.4/hr. Prolonged time in SpO2 desaturation. Please note that CO2 retention cannot be measured in HST, REM dependent apnea cannot be identified in a HST.  RECOMMENDATION: 1) Weight loss 2) Reduction in opioid medication overnight. 3) Return for attended sleep study with CPAP titration. CPAP therapy can improve hypoxemia where a dental device or ENT procedure cannot.  I certify that I have reviewed the raw data recording prior to the issuance of this report in accordance with the standards of Accreditation of the American Academy of Sleep medicine (AASM) Melvyn Novasarmen Weslie Rasmus, MD    05-26-2016  Diplomat, American Board of Psychiatry and Neurology/ Diplomat, American Board of Sleep Medicine Medical Director of [facility], an AASM accredited facility

## 2016-05-26 NOTE — Telephone Encounter (Signed)
[employer] denied CPAP in a patient on chronic narcotic  Therapy, ongoing smokers lung.

## 2016-05-26 NOTE — Addendum Note (Signed)
Addended by: Melvyn NovasHMEIER, Donnelle Olmeda on: 05/26/2016 12:23 PM   Modules accepted: Orders

## 2016-05-26 NOTE — Telephone Encounter (Signed)
[employer] denied CPAP suggest autopap. °

## 2016-05-27 NOTE — Telephone Encounter (Signed)
Patient called office returning RN's call.  Please call °

## 2016-05-30 NOTE — Telephone Encounter (Signed)
I called pt again to discuss her sleep study results. No answer, left a message asking her to call me back. 

## 2016-05-31 NOTE — Telephone Encounter (Signed)
Pt returned my call. I advised her that her sleep study showed mild sleep apnea with loud snoring with prolonged time with oxygen desaturations. I advised her that CO2 retention cannot be measured in HST or determine if the apnea is REM dependent. I advised pt to pursue weight loss and a reduction in opioid medication overnight. Dr. Vickey Huger recommends that pt return for an attended sleep study for a cpap titration since a cpap can improve hypoxemia where a dental device or ENT procedure cannot. Pt is agreeable to the cpap titration and I advised her that our sleep lab will call her to schedule this study. Pt verbalized understanding of results. Pt had no questions at this time but was encouraged to call back if questions arise.

## 2016-06-13 ENCOUNTER — Ambulatory Visit: Admission: RE | Admit: 2016-06-13 | Payer: 59 | Source: Ambulatory Visit | Admitting: Gastroenterology

## 2016-06-13 ENCOUNTER — Encounter: Admission: RE | Payer: Self-pay | Source: Ambulatory Visit

## 2016-06-13 SURGERY — COLONOSCOPY WITH PROPOFOL
Anesthesia: General

## 2016-11-17 ENCOUNTER — Other Ambulatory Visit: Payer: Self-pay | Admitting: Orthopedic Surgery

## 2016-11-17 DIAGNOSIS — M4802 Spinal stenosis, cervical region: Secondary | ICD-10-CM

## 2016-11-17 DIAGNOSIS — M5412 Radiculopathy, cervical region: Secondary | ICD-10-CM

## 2016-11-24 ENCOUNTER — Ambulatory Visit
Admission: RE | Admit: 2016-11-24 | Discharge: 2016-11-24 | Disposition: A | Discharge status: Home / Self Care | Payer: 59 | Source: Ambulatory Visit | Attending: Orthopedic Surgery | Admitting: Orthopedic Surgery

## 2016-11-24 DIAGNOSIS — M5412 Radiculopathy, cervical region: Secondary | ICD-10-CM

## 2016-11-24 DIAGNOSIS — Z981 Arthrodesis status: Secondary | ICD-10-CM | POA: Insufficient documentation

## 2016-11-24 DIAGNOSIS — M4802 Spinal stenosis, cervical region: Secondary | ICD-10-CM | POA: Insufficient documentation

## 2016-11-24 DIAGNOSIS — M5011 Cervical disc disorder with radiculopathy,  high cervical region: Secondary | ICD-10-CM | POA: Insufficient documentation

## 2017-05-15 ENCOUNTER — Encounter (HOSPITAL_COMMUNITY): Payer: Self-pay | Admitting: *Deleted

## 2017-05-15 ENCOUNTER — Emergency Department (HOSPITAL_COMMUNITY)
Admission: EM | Admit: 2017-05-15 | Discharge: 2017-05-16 | Disposition: A | Discharge status: Home / Self Care | Payer: 59 | Attending: Emergency Medicine | Admitting: Emergency Medicine

## 2017-05-15 ENCOUNTER — Other Ambulatory Visit: Payer: Self-pay

## 2017-05-15 ENCOUNTER — Emergency Department (HOSPITAL_COMMUNITY): Payer: 59

## 2017-05-15 DIAGNOSIS — M5020 Other cervical disc displacement, unspecified cervical region: Secondary | ICD-10-CM | POA: Insufficient documentation

## 2017-05-15 DIAGNOSIS — F17210 Nicotine dependence, cigarettes, uncomplicated: Secondary | ICD-10-CM | POA: Diagnosis not present

## 2017-05-15 DIAGNOSIS — Z79899 Other long term (current) drug therapy: Secondary | ICD-10-CM | POA: Insufficient documentation

## 2017-05-15 DIAGNOSIS — M5412 Radiculopathy, cervical region: Secondary | ICD-10-CM | POA: Diagnosis not present

## 2017-05-15 DIAGNOSIS — M79601 Pain in right arm: Secondary | ICD-10-CM | POA: Diagnosis present

## 2017-05-15 HISTORY — DX: Pain in unspecified shoulder: M25.519

## 2017-05-15 HISTORY — DX: Other chronic pain: G89.29

## 2017-05-15 MED ORDER — OXYCODONE-ACETAMINOPHEN 5-325 MG PO TABS
2.0000 | ORAL_TABLET | Freq: Once | ORAL | Status: AC
  Administered 2017-05-15: 2 via ORAL
  Filled 2017-05-15: qty 2

## 2017-05-15 NOTE — ED Notes (Signed)
Patient transported to MRI 

## 2017-05-15 NOTE — ED Provider Notes (Signed)
[facility] EMERGENCY DEPARTMENT Provider Note   CSN: [id number] Arrival date & time: 05/15/17  1635     History   Chief Complaint Chief Complaint  Patient presents with  . Arm Pain    HPI Mariah Douglas is a 57 y.o. female with history of known cervical spondylosis with radiculopathy who presents with worsening right arm pain and numbness and tingling over the past week.  She reports has seen neurosurgery and orthopedics for this problem and had an MRI which showed disc protrusion in October 2018.  She feels like her symptoms have been worsening and "creeping up" from her fingers to her elbow.  She is getting worsening weakness and dropping items.  She reports her pain and symptoms worsen after minimal use that she is writing for a few minutes.  She is not able to work due to her symptoms because her job requires a lot of writing and typing.  She has not been taking any medications regularly for her symptoms, however has taken Flexeril and add NSAID in the past with some relief.  She occasionally uses a heating pad.  HPI  Past Medical History:  Diagnosis Date  . Chronic shoulder pain   . Family history of anesthesia complication    Daughter N/V  . IBS (irritable bowel syndrome)   . Migraine     Patient Active Problem List   Diagnosis Date Noted  . Cervical spondylosis without myelopathy 08/06/2013  . Cervical spondylosis with radiculopathy 08/06/2013  . VIRAL INFECTION, ACUTE 08/13/2007  . TOBACCO USE 03/02/2007  . SINUSITIS- ACUTE-NOS 03/02/2007    Past Surgical History:  Procedure Laterality Date  . ANTERIOR CERVICAL DECOMP/DISCECTOMY FUSION N/A 08/06/2013   Procedure: ANTERIOR CERVICAL DECOMPRESSION/DISCECTOMY FUSION 3 LEVELS;  Surgeon: Temple Pacini, MD;  Location: [facility];  Service: Neurosurgery;  Laterality: N/A;  ANTERIOR CERVICAL DECOMPRESSION/DISCECTOMY FUSION 3 LEVELS C4-5,5-6,6-7  . BACK SURGERY     L5  . BREAST CYST ASPIRATION Left 2004    . COLONOSCOPY      OB History    No data available       Home Medications    Prior to Admission medications   Medication Sig Start Date End Date Taking? Authorizing Provider  Ascorbic Acid (VITAMIN C) 1000 MG tablet Take 1,000 mg by mouth at bedtime.    [provider]  cyclobenzaprine (FLEXERIL) 10 MG tablet Take 1 tablet (10 mg total) by mouth 2 (two) times daily as needed for muscle spasms. 05/16/17   Raijon Lindfors, Waylan Boga, PA-C  hydrocortisone cream 1 % Apply to affected area 2 times daily 04/09/14   Forcucci, Courtney, PA-C  Multiple Vitamin (MULTIVITAMIN WITH MINERALS) TABS tablet Take 1 tablet by mouth at bedtime.    [provider]  naproxen (NAPROSYN) 500 MG tablet Take 1 tablet (500 mg total) by mouth 2 (two) times daily. 05/16/17   Meleah Demeyer, Waylan Boga, PA-C  oxyCODONE (OXY IR/ROXICODONE) 5 MG immediate release tablet Take 1-2 tablets (5-10 mg total) by mouth every 3 (three) hours as needed for moderate pain. 08/07/13   Julio Sicks, MD    Family History Family History  Problem Relation Age of Onset  . Breast cancer Mother        late 43's  . Breast cancer Maternal Grandmother        late 75's    Social History Social History   Tobacco Use  . Smoking status: Current Every Day Smoker    Packs/day: 0.50  Years: 36.00    Pack years: 18.00    Types: Cigarettes  . Smokeless tobacco: Never Used  . Tobacco comment: Quit 2015  Substance Use Topics  . Alcohol use: Yes    Comment: Rare  . Drug use: No     Allergies   Hydrocodone-acetaminophen; Metronidazole; and Prednisone   Review of Systems Review of Systems  Constitutional: Negative for chills and fever.  HENT: Negative for facial swelling and sore throat.   Respiratory: Negative for shortness of breath.   Cardiovascular: Negative for chest pain.  Gastrointestinal: Negative for abdominal pain, nausea and vomiting.  Genitourinary: Negative for dysuria.  Musculoskeletal: Positive for arthralgias  and myalgias. Negative for back pain.  Skin: Negative for rash and wound.  Neurological: Positive for numbness. Negative for headaches.  Psychiatric/Behavioral: The patient is not nervous/anxious.      Physical Exam Updated Vital Signs BP (!) 153/101 (BP Location: Left Arm)   Pulse 80   Temp 98.8 F (37.1 C) (Oral)   Resp 18   Ht 5\' 1"  (1.549 m)   Wt 112.5 kg (248 lb)   SpO2 98%   BMI 46.86 kg/m   Physical Exam  Constitutional: She appears well-developed and well-nourished. No distress.  HENT:  Head: Normocephalic and atraumatic.  Mouth/Throat: Oropharynx is clear and moist. No oropharyngeal exudate.  Eyes: Conjunctivae are normal. Pupils are equal, round, and reactive to light. Right eye exhibits no discharge. Left eye exhibits no discharge. No scleral icterus.  Neck: Normal range of motion. Neck supple. No thyromegaly present.  Cardiovascular: Normal rate, regular rhythm, normal heart sounds and intact distal pulses. Exam reveals no gallop and no friction rub.  No murmur heard. Pulmonary/Chest: Effort normal and breath sounds normal. No stridor. No respiratory distress. She has no wheezes. She has no rales.  Abdominal: Soft. Bowel sounds are normal. She exhibits no distension. There is no tenderness. There is no rebound and no guarding.  Musculoskeletal: She exhibits no edema.       Right shoulder: She exhibits swelling and spasm.       Arms: RUE: Paresthesia to ulnar aspect of hand, however sensation intact, grip strength weaker in the right side as well as flexion extension of the arm (4/5 strength) No midline tenderness to the cervical, thoracic, or lumbar spine  Lymphadenopathy:    She has no cervical adenopathy.  Neurological: She is alert. Coordination normal.  Skin: Skin is warm and dry. No rash noted. She is not diaphoretic. No pallor.  Psychiatric: She has a normal mood and affect.  Nursing note and vitals reviewed.    ED Treatments / Results  Labs (all labs  ordered are listed, but only abnormal results are displayed) Labs Reviewed - No data to display  EKG  EKG Interpretation None       Radiology Mr Cervical Spine Wo Contrast  Result Date: 05/16/2017 CLINICAL DATA:  Initial evaluation for right hand and shoulder numbness. History of previous surgery. EXAM: MRI CERVICAL SPINE WITHOUT CONTRAST TECHNIQUE: Multiplanar, multisequence MR imaging of the cervical spine was performed. No intravenous contrast was administered. COMPARISON:  Previous MRI from 11/24/2016. FINDINGS: Alignment: Straightening of the normal cervical lordosis. No listhesis. Vertebrae: Susceptibility artifact from prior cervical fusion at C4 through C7. Vertebral body heights are maintained without evidence for acute or interval fracture. Bone marrow signal intensity within normal limits. No discrete or worrisome osseous lesions. No abnormal marrow edema. Cord: Signal intensity within the cervical spinal cord is normal. Posterior Fossa, vertebral arteries,  paraspinal tissues: Visualized brain and posterior fossa within normal limits. Incidental note made of a partially empty sella. Craniocervical junction normal. Paraspinous and prevertebral soft tissues within normal limits. Normal intravascular flow voids present within the vertebral arteries bilaterally. Disc levels: C2-C3: Unremarkable. C3-C4: Diffuse disc bulge with mild bilateral uncovertebral spurring. Superimposed small central disc protrusion indents the ventral thecal sac. Minimal flattening of the ventral spinal cord with mild spinal stenosis, stable from previous. Moderate right with mild left C4 foraminal stenosis, also relatively unchanged. C4-C5:  Prior ACDF.  No residual or recurrent stenosis. C5-C6:  Prior ACDF.  No residual or recurrent stenosis. C6-C7:  Prior ACDF.  No residual or recurrent stenosis. C7-T1:  Unremarkable. Visualized upper thoracic spine demonstrates no significant findings. IMPRESSION: 1. Prior ACDF at C4  through C7 without residual or recurrent stenosis. No adverse features. 2. 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 Findings are similar to previous. Electronically Signed   By: Rise Mu M.D.   On: 05/16/2017 00:22    Procedures Procedures (including critical care time)  Medications Ordered in ED Medications  oxyCODONE-acetaminophen (PERCOCET/ROXICET) 5-325 MG per tablet 2 tablet (2 tablets Oral Given 05/15/17 2002)     Initial Impression / Assessment and Plan / ED Course  I have reviewed the triage vital signs and the nursing notes.  Pertinent labs & imaging results that were available during my care of the patient were reviewed by me and considered in my medical decision making (see chart for details).     Patient with cervical radiculopathy.  Considering weakness on my exam and worsening symptoms, MRI was conducted today, which showed prior ACDF at C4-C7 without residual or recurrent stenosis.  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  Findings are similar to previous.  Considering spasm of the right upper trapezius, I feel this may be contributing.  Patient advised to begin taking Flexeril and NSAIDs regularly, as well as heat and ice.  She declines prednisone, as this makes her angry.  Advised her to follow-up with your doctor for recheck of blood pressure, as it is elevated today.  I have also advised to follow-up with her neurosurgeon, Dr. Dutch Quint, for further management of her symptoms. Return precautions discussed.  Patient understands and agrees with plan.  Patient vitals stable and discharged in satisfactory condition. I discussed patient case with Dr. Juleen China who guided the patient's management and agrees with plan.   Final Clinical Impressions(s)  / ED Diagnoses   Final diagnoses:  Protrusion of cervical intervertebral disc  Cervical radiculopathy    ED Discharge Orders        Ordered    oxyCODONE-acetaminophen (PERCOCET/ROXICET) 5-325 MG tablet  Every 6 hours PRN,   Status:  Discontinued     05/16/17 0020    naproxen (NAPROSYN) 500 MG tablet  2 times daily     05/16/17 0046    cyclobenzaprine (FLEXERIL) 10 MG tablet  2 times daily PRN     05/16/17 0046       Emi Holes, PA-C 05/16/17 0120    Raeford Razor, MD 05/16/17 [phone]

## 2017-05-15 NOTE — ED Triage Notes (Signed)
PT states hx of pinched nerve that causes R hand and shoulder numbness and pain normally.  Today the pain is worse and is travelling to her R elbow.

## 2017-05-16 ENCOUNTER — Encounter: Payer: Self-pay | Admitting: *Deleted

## 2017-05-16 MED ORDER — CYCLOBENZAPRINE HCL 10 MG PO TABS: 10.0000 mg | ORAL_TABLET | Freq: Two times a day (BID) | ORAL | 0 refills | Status: AC | PRN

## 2017-05-16 MED ORDER — NAPROXEN 500 MG PO TABS: 500.0000 mg | ORAL_TABLET | Freq: Two times a day (BID) | ORAL | 0 refills | Status: AC

## 2017-05-16 MED ORDER — OXYCODONE-ACETAMINOPHEN 5-325 MG PO TABS: 1.0000 | ORAL_TABLET | Freq: Four times a day (QID) | ORAL | 0 refills | Status: DC | PRN

## 2017-05-16 NOTE — ED Notes (Signed)
Pt verbalizes understanding of d/c instructions. Pt received prescriptions. Pt ambulatory at d/c with all belongings.  

## 2017-05-16 NOTE — Discharge Instructions (Addendum)
Take Flexeril twice daily as needed for muscle pain or spasms.  Take Naprosyn twice daily for 7-10 days.  Use heat 3-4 times daily alternating 20 minutes on, 20 minutes off.  Please follow-up with Dr. Dutch QuintPoole for further evaluation and treatment of your symptoms.  Please follow-up with your primary care provider for recheck of your blood pressure in 1-2 weeks.  Please return to the emergency department if you develop any new or worsening symptoms.

## 2017-05-16 NOTE — Progress Notes (Signed)
EDCM noted CM consult.  Reviewed chart and basis for consult are unclear as the CM consult order is incomplete and the EDP discharge note does not mention CM instructions.    

## 2021-09-24 ENCOUNTER — Other Ambulatory Visit: Payer: Self-pay | Admitting: Infectious Diseases

## 2021-09-24 DIAGNOSIS — Z1231 Encounter for screening mammogram for malignant neoplasm of breast: Secondary | ICD-10-CM

## 2021-11-09 ENCOUNTER — Ambulatory Visit
Admission: RE | Admit: 2021-11-09 | Discharge: 2021-11-09 | Disposition: A | Discharge status: Home / Self Care | Payer: No Typology Code available for payment source | Source: Ambulatory Visit | Attending: Infectious Diseases | Admitting: Infectious Diseases

## 2021-11-09 ENCOUNTER — Other Ambulatory Visit: Payer: Self-pay | Admitting: *Deleted

## 2021-11-09 ENCOUNTER — Inpatient Hospital Stay
Admission: RE | Admit: 2021-11-09 | Discharge: 2021-11-09 | Disposition: A | Discharge status: Home / Self Care | Payer: Self-pay | Source: Ambulatory Visit | Attending: *Deleted | Admitting: *Deleted

## 2021-11-09 DIAGNOSIS — Z1231 Encounter for screening mammogram for malignant neoplasm of breast: Secondary | ICD-10-CM

## 2024-01-26 ENCOUNTER — Other Ambulatory Visit: Payer: Self-pay | Admitting: Infectious Diseases

## 2024-01-26 DIAGNOSIS — F17211 Nicotine dependence, cigarettes, in remission: Secondary | ICD-10-CM

## 2024-01-26 DIAGNOSIS — R7303 Prediabetes: Secondary | ICD-10-CM

## 2024-01-26 DIAGNOSIS — Z720 Tobacco use: Secondary | ICD-10-CM

## 2024-01-26 DIAGNOSIS — Z122 Encounter for screening for malignant neoplasm of respiratory organs: Secondary | ICD-10-CM
